# Patient Record
Sex: Female | Born: 1960 | Race: White | Hispanic: No | Marital: Married | Smoking: Former smoker
Health system: Southern US, Community
[De-identification: ages and names within clinical notes are randomized; demographics above are authoritative.]

## PROBLEM LIST (undated history)

## (undated) DIAGNOSIS — Z8669 Personal history of other diseases of the nervous system and sense organs: Secondary | ICD-10-CM

## (undated) DIAGNOSIS — M8580 Other specified disorders of bone density and structure, unspecified site: Secondary | ICD-10-CM

## (undated) DIAGNOSIS — R002 Palpitations: Secondary | ICD-10-CM

## (undated) DIAGNOSIS — B009 Herpesviral infection, unspecified: Secondary | ICD-10-CM

## (undated) DIAGNOSIS — E278 Other specified disorders of adrenal gland: Secondary | ICD-10-CM

## (undated) DIAGNOSIS — F419 Anxiety disorder, unspecified: Secondary | ICD-10-CM

## (undated) DIAGNOSIS — K5520 Angiodysplasia of colon without hemorrhage: Secondary | ICD-10-CM

## (undated) DIAGNOSIS — R1012 Left upper quadrant pain: Secondary | ICD-10-CM

## (undated) HISTORY — DX: Other specified disorders of bone density and structure, unspecified site: M85.80

## (undated) HISTORY — DX: Left upper quadrant pain: R10.12

## (undated) HISTORY — DX: Other specified disorders of adrenal gland: E27.8

## (undated) HISTORY — DX: Herpesviral infection, unspecified: B00.9

## (undated) HISTORY — PX: UPPER GASTROINTESTINAL ENDOSCOPY: SHX188

## (undated) HISTORY — PX: MOUTH SURGERY: SHX715

## (undated) HISTORY — DX: Palpitations: R00.2

## (undated) HISTORY — DX: Angiodysplasia of colon without hemorrhage: K55.20

## (undated) HISTORY — PX: COLONOSCOPY: SHX174

## (undated) HISTORY — DX: Anxiety disorder, unspecified: F41.9

## (undated) HISTORY — DX: Personal history of other diseases of the nervous system and sense organs: Z86.69

---

## 1997-04-06 HISTORY — PX: NASAL SINUS SURGERY: SHX719

## 2001-03-09 ENCOUNTER — Other Ambulatory Visit: Admission: RE | Admit: 2001-03-09 | Discharge: 2001-03-09 | Payer: Self-pay | Admitting: Obstetrics and Gynecology

## 2001-06-01 ENCOUNTER — Encounter: Payer: Self-pay | Admitting: Obstetrics and Gynecology

## 2001-06-01 ENCOUNTER — Encounter: Admission: RE | Admit: 2001-06-01 | Discharge: 2001-06-01 | Payer: Self-pay | Admitting: Obstetrics and Gynecology

## 2002-06-07 ENCOUNTER — Encounter: Admission: RE | Admit: 2002-06-07 | Discharge: 2002-06-07 | Payer: Self-pay | Admitting: Obstetrics and Gynecology

## 2002-06-07 ENCOUNTER — Encounter: Payer: Self-pay | Admitting: Obstetrics and Gynecology

## 2003-04-18 ENCOUNTER — Emergency Department (HOSPITAL_COMMUNITY): Admission: EM | Admit: 2003-04-18 | Discharge: 2003-04-18 | Payer: Self-pay | Admitting: Emergency Medicine

## 2003-06-08 ENCOUNTER — Encounter: Admission: RE | Admit: 2003-06-08 | Discharge: 2003-06-08 | Payer: Self-pay | Admitting: Obstetrics and Gynecology

## 2004-03-10 ENCOUNTER — Ambulatory Visit: Payer: Self-pay | Admitting: Internal Medicine

## 2004-03-18 ENCOUNTER — Ambulatory Visit: Payer: Self-pay

## 2004-09-09 ENCOUNTER — Encounter: Admission: RE | Admit: 2004-09-09 | Discharge: 2004-09-09 | Payer: Self-pay | Admitting: Obstetrics and Gynecology

## 2005-01-12 ENCOUNTER — Ambulatory Visit: Payer: Self-pay | Admitting: Internal Medicine

## 2005-02-13 ENCOUNTER — Ambulatory Visit: Payer: Self-pay | Admitting: Internal Medicine

## 2005-03-06 DIAGNOSIS — R1012 Left upper quadrant pain: Secondary | ICD-10-CM

## 2005-03-06 HISTORY — DX: Left upper quadrant pain: R10.12

## 2005-03-30 ENCOUNTER — Emergency Department (HOSPITAL_COMMUNITY): Admission: EM | Admit: 2005-03-30 | Discharge: 2005-03-30 | Payer: Self-pay | Admitting: Emergency Medicine

## 2005-03-31 ENCOUNTER — Ambulatory Visit: Payer: Self-pay | Admitting: Internal Medicine

## 2005-04-02 ENCOUNTER — Ambulatory Visit: Payer: Self-pay | Admitting: *Deleted

## 2005-04-07 ENCOUNTER — Ambulatory Visit: Payer: Self-pay | Admitting: Internal Medicine

## 2005-04-09 ENCOUNTER — Encounter: Payer: Self-pay | Admitting: Internal Medicine

## 2005-04-09 ENCOUNTER — Ambulatory Visit: Payer: Self-pay | Admitting: Internal Medicine

## 2005-04-14 ENCOUNTER — Encounter (INDEPENDENT_AMBULATORY_CARE_PROVIDER_SITE_OTHER): Payer: Self-pay | Admitting: *Deleted

## 2005-04-14 ENCOUNTER — Ambulatory Visit (HOSPITAL_BASED_OUTPATIENT_CLINIC_OR_DEPARTMENT_OTHER): Admission: RE | Admit: 2005-04-14 | Discharge: 2005-04-14 | Payer: Self-pay | Admitting: General Surgery

## 2005-04-16 ENCOUNTER — Encounter (INDEPENDENT_AMBULATORY_CARE_PROVIDER_SITE_OTHER): Payer: Self-pay | Admitting: *Deleted

## 2005-04-16 ENCOUNTER — Ambulatory Visit: Payer: Self-pay | Admitting: Internal Medicine

## 2005-05-01 ENCOUNTER — Ambulatory Visit: Payer: Self-pay | Admitting: Internal Medicine

## 2005-06-15 ENCOUNTER — Ambulatory Visit (HOSPITAL_COMMUNITY): Admission: RE | Admit: 2005-06-15 | Discharge: 2005-06-15 | Payer: Self-pay | Admitting: Family Medicine

## 2005-09-08 ENCOUNTER — Ambulatory Visit: Payer: Self-pay | Admitting: Internal Medicine

## 2005-09-23 ENCOUNTER — Ambulatory Visit: Payer: Self-pay | Admitting: Internal Medicine

## 2005-09-30 ENCOUNTER — Encounter: Admission: RE | Admit: 2005-09-30 | Discharge: 2005-09-30 | Payer: Self-pay | Admitting: Obstetrics and Gynecology

## 2005-10-15 ENCOUNTER — Ambulatory Visit: Payer: Self-pay | Admitting: Internal Medicine

## 2005-10-29 ENCOUNTER — Encounter: Payer: Self-pay | Admitting: Internal Medicine

## 2005-10-29 ENCOUNTER — Ambulatory Visit (HOSPITAL_COMMUNITY): Admission: RE | Admit: 2005-10-29 | Discharge: 2005-10-29 | Payer: Self-pay | Admitting: Internal Medicine

## 2005-12-01 ENCOUNTER — Ambulatory Visit (HOSPITAL_COMMUNITY): Admission: RE | Admit: 2005-12-01 | Discharge: 2005-12-01 | Payer: Self-pay | Admitting: Internal Medicine

## 2005-12-10 ENCOUNTER — Encounter: Payer: Self-pay | Admitting: Internal Medicine

## 2005-12-18 ENCOUNTER — Ambulatory Visit: Payer: Self-pay | Admitting: Internal Medicine

## 2005-12-28 ENCOUNTER — Ambulatory Visit: Payer: Self-pay | Admitting: Cardiology

## 2006-01-01 ENCOUNTER — Encounter: Payer: Self-pay | Admitting: Internal Medicine

## 2006-02-12 ENCOUNTER — Ambulatory Visit: Payer: Self-pay | Admitting: Endocrinology

## 2006-05-26 ENCOUNTER — Ambulatory Visit: Payer: Self-pay | Admitting: Internal Medicine

## 2006-06-04 ENCOUNTER — Ambulatory Visit: Payer: Self-pay | Admitting: Internal Medicine

## 2006-06-09 ENCOUNTER — Ambulatory Visit: Payer: Self-pay | Admitting: Internal Medicine

## 2006-06-10 ENCOUNTER — Ambulatory Visit: Payer: Self-pay | Admitting: Internal Medicine

## 2006-06-10 ENCOUNTER — Encounter: Payer: Self-pay | Admitting: Internal Medicine

## 2006-06-10 LAB — CONVERTED CEMR LAB: Cortisol, Plasma: 1.3 ug/dL

## 2006-07-15 ENCOUNTER — Ambulatory Visit: Payer: Self-pay | Admitting: Internal Medicine

## 2006-07-16 ENCOUNTER — Ambulatory Visit: Payer: Self-pay | Admitting: Internal Medicine

## 2006-09-08 ENCOUNTER — Ambulatory Visit: Payer: Self-pay | Admitting: Internal Medicine

## 2006-10-12 ENCOUNTER — Encounter: Admission: RE | Admit: 2006-10-12 | Discharge: 2006-10-12 | Payer: Self-pay | Admitting: Obstetrics and Gynecology

## 2006-11-22 LAB — CONVERTED CEMR LAB: Pap Smear: NORMAL

## 2006-12-03 ENCOUNTER — Ambulatory Visit: Payer: Self-pay | Admitting: Internal Medicine

## 2006-12-03 LAB — CONVERTED CEMR LAB
ALT: 14 units/L (ref 0–35)
Alkaline Phosphatase: 37 units/L — ABNORMAL LOW (ref 39–117)
Creatinine, Ser: 0.7 mg/dL (ref 0.4–1.2)
Eosinophils Relative: 1.1 % (ref 0.0–5.0)
Folate: 10.4 ng/mL
GFR calc Af Amer: 115 mL/min
GFR calc non Af Amer: 95 mL/min
Hemoglobin: 12.1 g/dL (ref 12.0–15.0)
Iron: 73 ug/dL (ref 42–145)
Monocytes Relative: 7.6 % (ref 3.0–11.0)
Neutrophils Relative %: 67.4 % (ref 43.0–77.0)
Platelets: 377 10*3/uL (ref 150–400)
Potassium: 4.1 meq/L (ref 3.5–5.1)
RDW: 13.3 % (ref 11.5–14.6)
Sodium: 138 meq/L (ref 135–145)
TSH: 1.19 microintl units/mL (ref 0.35–5.50)
Total Protein: 6.6 g/dL (ref 6.0–8.3)
WBC: 10.6 10*3/uL — ABNORMAL HIGH (ref 4.5–10.5)

## 2006-12-10 ENCOUNTER — Encounter: Admission: RE | Admit: 2006-12-10 | Discharge: 2006-12-10 | Payer: Self-pay | Admitting: Internal Medicine

## 2006-12-14 ENCOUNTER — Encounter: Payer: Self-pay | Admitting: Internal Medicine

## 2006-12-20 ENCOUNTER — Encounter: Payer: Self-pay | Admitting: Internal Medicine

## 2007-02-25 ENCOUNTER — Ambulatory Visit: Payer: Self-pay | Admitting: Internal Medicine

## 2007-04-08 ENCOUNTER — Ambulatory Visit: Payer: Self-pay | Admitting: Internal Medicine

## 2007-04-08 DIAGNOSIS — R002 Palpitations: Secondary | ICD-10-CM

## 2007-04-15 LAB — CONVERTED CEMR LAB
CO2: 30 meq/L (ref 19–32)
Creatinine, Ser: 0.8 mg/dL (ref 0.4–1.2)
Free T4: 0.7 ng/dL (ref 0.6–1.6)
GFR calc Af Amer: 99 mL/min
Glucose, Bld: 89 mg/dL (ref 70–99)
Potassium: 4.1 meq/L (ref 3.5–5.1)
T3, Free: 2.4 pg/mL (ref 2.3–4.2)

## 2007-05-06 DIAGNOSIS — G43909 Migraine, unspecified, not intractable, without status migrainosus: Secondary | ICD-10-CM | POA: Insufficient documentation

## 2007-05-06 DIAGNOSIS — M899 Disorder of bone, unspecified: Secondary | ICD-10-CM | POA: Insufficient documentation

## 2007-05-06 DIAGNOSIS — M949 Disorder of cartilage, unspecified: Secondary | ICD-10-CM

## 2007-05-28 ENCOUNTER — Encounter: Payer: Self-pay | Admitting: Internal Medicine

## 2007-05-30 ENCOUNTER — Telehealth (INDEPENDENT_AMBULATORY_CARE_PROVIDER_SITE_OTHER): Payer: Self-pay | Admitting: *Deleted

## 2007-06-02 ENCOUNTER — Ambulatory Visit: Payer: Self-pay | Admitting: Internal Medicine

## 2007-06-02 DIAGNOSIS — M549 Dorsalgia, unspecified: Secondary | ICD-10-CM | POA: Insufficient documentation

## 2007-06-02 DIAGNOSIS — A6000 Herpesviral infection of urogenital system, unspecified: Secondary | ICD-10-CM | POA: Insufficient documentation

## 2007-06-02 DIAGNOSIS — D759 Disease of blood and blood-forming organs, unspecified: Secondary | ICD-10-CM | POA: Insufficient documentation

## 2007-06-02 DIAGNOSIS — F411 Generalized anxiety disorder: Secondary | ICD-10-CM | POA: Insufficient documentation

## 2007-06-03 ENCOUNTER — Encounter (INDEPENDENT_AMBULATORY_CARE_PROVIDER_SITE_OTHER): Payer: Self-pay | Admitting: *Deleted

## 2007-06-14 ENCOUNTER — Ambulatory Visit: Payer: Self-pay | Admitting: Internal Medicine

## 2007-06-15 ENCOUNTER — Ambulatory Visit (HOSPITAL_COMMUNITY): Payer: Self-pay | Admitting: Oncology

## 2007-06-15 ENCOUNTER — Encounter: Payer: Self-pay | Admitting: Internal Medicine

## 2007-06-15 ENCOUNTER — Encounter (HOSPITAL_COMMUNITY): Admission: RE | Admit: 2007-06-15 | Discharge: 2007-07-15 | Payer: Self-pay | Admitting: Oncology

## 2007-07-04 ENCOUNTER — Encounter: Payer: Self-pay | Admitting: Internal Medicine

## 2007-09-28 ENCOUNTER — Telehealth (INDEPENDENT_AMBULATORY_CARE_PROVIDER_SITE_OTHER): Payer: Self-pay | Admitting: *Deleted

## 2007-09-30 ENCOUNTER — Ambulatory Visit: Payer: Self-pay | Admitting: Internal Medicine

## 2007-09-30 DIAGNOSIS — K589 Irritable bowel syndrome without diarrhea: Secondary | ICD-10-CM

## 2007-10-01 LAB — CONVERTED CEMR LAB
BUN: 17 mg/dL (ref 6–23)
Basophils Absolute: 0.1 10*3/uL (ref 0.0–0.1)
Basophils Relative: 0.9 % (ref 0.0–1.0)
CO2: 26 meq/L (ref 19–32)
Calcium: 9.2 mg/dL (ref 8.4–10.5)
Eosinophils Absolute: 0.2 10*3/uL (ref 0.0–0.7)
Eosinophils Relative: 1.3 % (ref 0.0–5.0)
GFR calc Af Amer: 86 mL/min
Glucose, Bld: 93 mg/dL (ref 70–99)
HCT: 39 % (ref 36.0–46.0)
Hemoglobin: 13.3 g/dL (ref 12.0–15.0)
MCHC: 34 g/dL (ref 30.0–36.0)
MCV: 89.4 fL (ref 78.0–100.0)
Monocytes Absolute: 0.8 10*3/uL (ref 0.1–1.0)
Neutro Abs: 8.7 10*3/uL — ABNORMAL HIGH (ref 1.4–7.7)
RBC: 4.36 M/uL (ref 3.87–5.11)
Sodium: 139 meq/L (ref 135–145)
TSH: 1.02 microintl units/mL (ref 0.35–5.50)
WBC: 12.5 10*3/uL — ABNORMAL HIGH (ref 4.5–10.5)

## 2007-10-25 ENCOUNTER — Encounter: Admission: RE | Admit: 2007-10-25 | Discharge: 2007-10-25 | Payer: Self-pay | Admitting: *Deleted

## 2008-01-03 ENCOUNTER — Encounter: Payer: Self-pay | Admitting: Internal Medicine

## 2008-01-04 ENCOUNTER — Encounter: Payer: Self-pay | Admitting: Internal Medicine

## 2008-01-06 ENCOUNTER — Telehealth: Payer: Self-pay | Admitting: Internal Medicine

## 2008-01-06 ENCOUNTER — Ambulatory Visit: Payer: Self-pay | Admitting: Gastroenterology

## 2008-01-10 ENCOUNTER — Telehealth: Payer: Self-pay | Admitting: Internal Medicine

## 2008-01-12 ENCOUNTER — Telehealth (INDEPENDENT_AMBULATORY_CARE_PROVIDER_SITE_OTHER): Payer: Self-pay | Admitting: *Deleted

## 2008-01-13 ENCOUNTER — Telehealth: Payer: Self-pay | Admitting: Internal Medicine

## 2008-01-13 ENCOUNTER — Ambulatory Visit: Payer: Self-pay | Admitting: Internal Medicine

## 2008-01-13 DIAGNOSIS — Z91041 Radiographic dye allergy status: Secondary | ICD-10-CM

## 2008-01-17 ENCOUNTER — Encounter: Payer: Self-pay | Admitting: Internal Medicine

## 2008-01-17 ENCOUNTER — Ambulatory Visit: Payer: Self-pay | Admitting: Internal Medicine

## 2008-01-19 ENCOUNTER — Telehealth (INDEPENDENT_AMBULATORY_CARE_PROVIDER_SITE_OTHER): Payer: Self-pay | Admitting: *Deleted

## 2008-01-27 ENCOUNTER — Ambulatory Visit: Payer: Self-pay | Admitting: Internal Medicine

## 2008-01-31 ENCOUNTER — Encounter: Payer: Self-pay | Admitting: Internal Medicine

## 2008-02-01 LAB — CONVERTED CEMR LAB: Calcium, Total (PTH): 9.1 mg/dL (ref 8.4–10.5)

## 2008-02-07 ENCOUNTER — Telehealth: Payer: Self-pay | Admitting: Internal Medicine

## 2008-02-24 ENCOUNTER — Ambulatory Visit: Payer: Self-pay | Admitting: Internal Medicine

## 2008-03-07 ENCOUNTER — Telehealth: Payer: Self-pay | Admitting: Internal Medicine

## 2008-03-12 ENCOUNTER — Ambulatory Visit: Payer: Self-pay | Admitting: Internal Medicine

## 2008-03-14 ENCOUNTER — Encounter: Payer: Self-pay | Admitting: Internal Medicine

## 2008-04-05 ENCOUNTER — Telehealth: Payer: Self-pay | Admitting: Internal Medicine

## 2008-04-05 DIAGNOSIS — K633 Ulcer of intestine: Secondary | ICD-10-CM

## 2008-04-30 ENCOUNTER — Telehealth: Payer: Self-pay | Admitting: Internal Medicine

## 2008-05-14 ENCOUNTER — Encounter: Payer: Self-pay | Admitting: Internal Medicine

## 2008-05-25 ENCOUNTER — Encounter: Payer: Self-pay | Admitting: Internal Medicine

## 2008-07-02 ENCOUNTER — Telehealth: Payer: Self-pay | Admitting: Internal Medicine

## 2008-07-05 ENCOUNTER — Ambulatory Visit: Payer: Self-pay | Admitting: Internal Medicine

## 2008-07-09 ENCOUNTER — Ambulatory Visit: Payer: Self-pay | Admitting: Internal Medicine

## 2008-07-09 ENCOUNTER — Encounter: Payer: Self-pay | Admitting: Internal Medicine

## 2008-07-20 ENCOUNTER — Telehealth (INDEPENDENT_AMBULATORY_CARE_PROVIDER_SITE_OTHER): Payer: Self-pay | Admitting: *Deleted

## 2008-07-24 ENCOUNTER — Ambulatory Visit: Payer: Self-pay | Admitting: Internal Medicine

## 2008-07-24 DIAGNOSIS — J310 Chronic rhinitis: Secondary | ICD-10-CM | POA: Insufficient documentation

## 2008-09-11 ENCOUNTER — Telehealth: Payer: Self-pay | Admitting: Internal Medicine

## 2008-09-11 ENCOUNTER — Ambulatory Visit: Payer: Self-pay | Admitting: Family Medicine

## 2008-09-11 ENCOUNTER — Telehealth (INDEPENDENT_AMBULATORY_CARE_PROVIDER_SITE_OTHER): Payer: Self-pay | Admitting: *Deleted

## 2008-09-11 DIAGNOSIS — R0602 Shortness of breath: Secondary | ICD-10-CM

## 2008-09-11 DIAGNOSIS — N951 Menopausal and female climacteric states: Secondary | ICD-10-CM

## 2008-09-12 ENCOUNTER — Encounter: Payer: Self-pay | Admitting: Family Medicine

## 2008-09-12 ENCOUNTER — Encounter: Admission: RE | Admit: 2008-09-12 | Discharge: 2008-09-12 | Payer: Self-pay | Admitting: Family Medicine

## 2008-09-18 ENCOUNTER — Ambulatory Visit: Payer: Self-pay | Admitting: Family Medicine

## 2008-09-20 LAB — CONVERTED CEMR LAB
Basophils Absolute: 0 K/uL (ref 0.0–0.1)
Basophils Relative: 0.5 % (ref 0.0–3.0)
Eosinophils Absolute: 0.1 K/uL (ref 0.0–0.7)
Eosinophils Relative: 0.6 % (ref 0.0–5.0)
HCT: 41 % (ref 36.0–46.0)
Hemoglobin: 14.1 g/dL (ref 12.0–15.0)
Lymphocytes Relative: 22.2 % (ref 12.0–46.0)
Lymphs Abs: 1.9 K/uL (ref 0.7–4.0)
MCHC: 34.5 g/dL (ref 30.0–36.0)
MCV: 89.8 fL (ref 78.0–100.0)
Monocytes Absolute: 0.7 K/uL (ref 0.1–1.0)
Monocytes Relative: 7.5 % (ref 3.0–12.0)
Neutro Abs: 6 K/uL (ref 1.4–7.7)
Neutrophils Relative %: 69.2 % (ref 43.0–77.0)
Platelets: 338 K/uL (ref 150.0–400.0)
RBC: 4.56 M/uL (ref 3.87–5.11)
RDW: 12.4 % (ref 11.5–14.6)
WBC: 8.7 10*3/microliter (ref 4.5–10.5)

## 2008-10-10 ENCOUNTER — Encounter: Payer: Self-pay | Admitting: Internal Medicine

## 2008-10-10 ENCOUNTER — Telehealth: Payer: Self-pay | Admitting: Internal Medicine

## 2008-10-10 ENCOUNTER — Ambulatory Visit: Payer: Self-pay | Admitting: Family Medicine

## 2008-10-11 ENCOUNTER — Telehealth: Payer: Self-pay | Admitting: Family Medicine

## 2008-10-19 ENCOUNTER — Ambulatory Visit: Payer: Self-pay | Admitting: Internal Medicine

## 2008-10-22 ENCOUNTER — Telehealth: Payer: Self-pay | Admitting: Internal Medicine

## 2008-10-24 ENCOUNTER — Ambulatory Visit: Payer: Self-pay | Admitting: Internal Medicine

## 2008-10-25 ENCOUNTER — Encounter: Admission: RE | Admit: 2008-10-25 | Discharge: 2008-10-25 | Payer: Self-pay | Admitting: Family Medicine

## 2008-10-25 ENCOUNTER — Telehealth: Payer: Self-pay | Admitting: Internal Medicine

## 2008-11-01 ENCOUNTER — Ambulatory Visit: Payer: Self-pay | Admitting: Internal Medicine

## 2008-11-02 ENCOUNTER — Ambulatory Visit: Payer: Self-pay | Admitting: Internal Medicine

## 2008-11-23 ENCOUNTER — Ambulatory Visit: Payer: Self-pay | Admitting: Family Medicine

## 2008-11-23 DIAGNOSIS — Q767 Congenital malformation of sternum: Secondary | ICD-10-CM

## 2008-11-23 DIAGNOSIS — Q766 Other congenital malformations of ribs: Secondary | ICD-10-CM

## 2008-12-27 ENCOUNTER — Telehealth: Payer: Self-pay | Admitting: Family Medicine

## 2008-12-28 ENCOUNTER — Ambulatory Visit: Payer: Self-pay | Admitting: Family Medicine

## 2008-12-28 ENCOUNTER — Telehealth: Payer: Self-pay | Admitting: Family Medicine

## 2008-12-28 DIAGNOSIS — K3189 Other diseases of stomach and duodenum: Secondary | ICD-10-CM | POA: Insufficient documentation

## 2008-12-28 DIAGNOSIS — R1013 Epigastric pain: Secondary | ICD-10-CM

## 2009-01-14 ENCOUNTER — Encounter: Payer: Self-pay | Admitting: Family Medicine

## 2009-02-26 ENCOUNTER — Ambulatory Visit: Payer: Self-pay | Admitting: Family Medicine

## 2009-02-26 DIAGNOSIS — N390 Urinary tract infection, site not specified: Secondary | ICD-10-CM

## 2009-02-26 LAB — CONVERTED CEMR LAB
Bilirubin Urine: NEGATIVE
Protein, U semiquant: NEGATIVE
Urobilinogen, UA: 0.2

## 2009-03-01 ENCOUNTER — Telehealth: Payer: Self-pay | Admitting: Family Medicine

## 2009-03-07 ENCOUNTER — Telehealth (INDEPENDENT_AMBULATORY_CARE_PROVIDER_SITE_OTHER): Payer: Self-pay | Admitting: *Deleted

## 2009-04-08 ENCOUNTER — Ambulatory Visit: Payer: Self-pay | Admitting: Family Medicine

## 2009-04-08 DIAGNOSIS — J029 Acute pharyngitis, unspecified: Secondary | ICD-10-CM

## 2009-04-24 ENCOUNTER — Ambulatory Visit: Payer: Self-pay | Admitting: Family Medicine

## 2009-04-24 DIAGNOSIS — I781 Nevus, non-neoplastic: Secondary | ICD-10-CM

## 2009-04-24 DIAGNOSIS — R5381 Other malaise: Secondary | ICD-10-CM | POA: Insufficient documentation

## 2009-04-24 DIAGNOSIS — R5383 Other fatigue: Secondary | ICD-10-CM

## 2009-04-24 DIAGNOSIS — R0989 Other specified symptoms and signs involving the circulatory and respiratory systems: Secondary | ICD-10-CM | POA: Insufficient documentation

## 2009-04-24 DIAGNOSIS — R0609 Other forms of dyspnea: Secondary | ICD-10-CM

## 2009-04-25 LAB — CONVERTED CEMR LAB
CO2: 23 meq/L (ref 19–32)
Cholesterol: 198 mg/dL (ref 0–200)
Creatinine, Ser: 0.87 mg/dL (ref 0.40–1.20)
Glucose, Bld: 91 mg/dL (ref 70–99)
HCT: 39.6 % (ref 36.0–46.0)
HDL: 60 mg/dL (ref >39)
MCV: 87.6 fL (ref 78.0–100.0)
Platelets: 401 10*3/uL — ABNORMAL HIGH (ref 150–400)
RBC: 4.52 M/uL (ref 3.87–5.11)
Total Bilirubin: 0.5 mg/dL (ref 0.3–1.2)
Total CHOL/HDL Ratio: 3.3
Triglycerides: 72 mg/dL (ref <150)
VLDL: 14 mg/dL (ref 0–40)
WBC: 10.3 10*3/uL (ref 4.0–10.5)

## 2009-05-07 ENCOUNTER — Telehealth (INDEPENDENT_AMBULATORY_CARE_PROVIDER_SITE_OTHER): Payer: Self-pay | Admitting: *Deleted

## 2009-05-08 ENCOUNTER — Encounter (HOSPITAL_COMMUNITY): Admission: RE | Admit: 2009-05-08 | Discharge: 2009-07-11 | Payer: Self-pay | Admitting: Family Medicine

## 2009-05-08 ENCOUNTER — Ambulatory Visit: Payer: Self-pay | Admitting: Cardiovascular Disease

## 2009-05-08 ENCOUNTER — Ambulatory Visit: Payer: Self-pay

## 2009-08-22 ENCOUNTER — Telehealth (INDEPENDENT_AMBULATORY_CARE_PROVIDER_SITE_OTHER): Payer: Self-pay | Admitting: *Deleted

## 2009-11-27 ENCOUNTER — Encounter: Admission: RE | Admit: 2009-11-27 | Discharge: 2009-11-27 | Payer: Self-pay | Admitting: Obstetrics

## 2010-03-11 ENCOUNTER — Ambulatory Visit: Payer: Self-pay | Admitting: Family Medicine

## 2010-03-11 DIAGNOSIS — J321 Chronic frontal sinusitis: Secondary | ICD-10-CM

## 2010-04-26 ENCOUNTER — Encounter: Payer: Self-pay | Admitting: Internal Medicine

## 2010-04-27 ENCOUNTER — Encounter: Payer: Self-pay | Admitting: Internal Medicine

## 2010-04-27 ENCOUNTER — Encounter (HOSPITAL_COMMUNITY): Payer: Self-pay | Admitting: Oncology

## 2010-05-04 LAB — CONVERTED CEMR LAB
ALT: 15 units/L (ref 0–35)
AST: 21 units/L (ref 0–37)
Albumin: 4.2 g/dL (ref 3.5–5.2)
Basophils Absolute: 0 10*3/uL (ref 0.0–0.1)
Bilirubin Urine: NEGATIVE
CO2: 28 meq/L (ref 19–32)
Calcium: 9 mg/dL (ref 8.4–10.5)
Chloride: 103 meq/L (ref 96–112)
Eosinophils Relative: 0.1 % (ref 0.0–5.0)
Hemoglobin: 13.2 g/dL (ref 12.0–15.0)
Lymphocytes Relative: 8.7 % — ABNORMAL LOW (ref 12.0–46.0)
Monocytes Relative: 3.2 % (ref 3.0–12.0)
Platelets: 382 10*3/uL (ref 150.0–400.0)
Potassium: 4.5 meq/L (ref 3.5–5.1)
RDW: 12.5 % (ref 11.5–14.6)
Specific Gravity, Urine: 1.025
TSH: 1.11 microintl units/mL (ref 0.35–5.50)
Total Protein: 7.5 g/dL (ref 6.0–8.3)
Urobilinogen, UA: 0.2
WBC Urine, dipstick: NEGATIVE
WBC: 15.2 10*3/uL — ABNORMAL HIGH (ref 4.5–10.5)

## 2010-05-06 NOTE — Assessment & Plan Note (Signed)
Summary: CPE w/o pap   Vital Signs:  Patient profile:   50 year old female Height:      69 inches Weight:      146 pounds BMI:     21.64 O2 Sat:      98 % on Room air Pulse rate:   71 / minute BP sitting:   113 / 76  (left arm) Cuff size:   regular  Vitals Entered By: Payton Spark CMA (April 24, 2009 8:16 AM)  O2 Flow:  Room air CC: CPE w/ out pap   Primary Care Provider:  Seymour Bars DO  CC:  CPE w/ out pap.  History of Present Illness: 50 yo WF presents for CPE.  She is G2P2, perimenopausal.  Sees [facility] for gyn exams.   Mammogram was updated July 2010.  Colonoscopy done in 09, normal.  She is previously healthy other than chronic complaints of upper abdominal pain and she has had DOE on and off.  She is not a smoker.  She is due for fasting labs.  She had a normal DEXA scan in April 2010.  Her tetanus vaccine is due.  She would like to restart Nasonex for chronic postnasal drip.  She has started exercising though not on a regular basis.    Current Medications (verified): 1)  Multivitamins  Tabs (Multiple Vitamin) .... Take 1 Tab Daily 2)  Valtrex 1 Gm Tabs (Valacyclovir Hcl) .... 1/2 Tablet By Mouth Once Daily 3)  Ventolin Hfa 108 (90 Base) Mcg/act  Aers (Albuterol Sulfate) .Marland Kitchen.. 1-2 Puffs Every 4-6 Hours As Needed 4)  Caltrate 600+d 600-400 Mg-Unit Tabs (Calcium Carbonate-Vitamin D) .Marland Kitchen.. 1 Once Daily  Allergies (verified): 1)  ! Sulfa 2)  ! 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 s/p sinus surgury 1999 NO PRIOR ABDOMINAL SURGERY  Family History: Reviewed history from 09/18/2008 and no changes required.  NO FAM HX COLON CA TB-  Father, step daughter had positive skin test. Mother osteoporosis, heart murmur Father A. Fib.  Social History: Reviewed history from 07/05/2008 and no changes required. Occupation: Air traffic controller, Production designer, theatre/television/film Married Former smoker.  Smoked while in [place].  Only smoked socially. Alcohol use-yes 2 children  Review of Systems  The patient denies anorexia, fever, weight loss, weight gain, vision loss, decreased hearing, hoarseness, chest pain, syncope, dyspnea on exertion, peripheral edema, prolonged cough, headaches, hemoptysis, abdominal pain, melena, hematochezia, severe indigestion/heartburn, hematuria, incontinence, genital sores, muscle weakness, suspicious skin lesions, transient blindness, difficulty walking, depression, unusual weight change, abnormal bleeding, enlarged lymph nodes, angioedema, breast masses, and testicular masses.    Physical Exam  General:  alert, well-developed, well-nourished, and well-hydrated.   Head:  normocephalic and atraumatic.   Eyes:  pupils equal, pupils round, and pupils reactive to light.   Ears:  EACs patent; TMs translucent and gray with good cone of light and bony landmarks.  Nose:  no nasal discharge.   Mouth:  good dentition and pharynx pink and moist.  cobblestoning Neck:  no masses.   Lungs:  Normal respiratory effort, chest expands symmetrically. Lungs are clear to auscultation, no crackles or wheezes. Heart:  Normal rate and regular rhythm. S1 and S2 normal  without gallop, murmur, click, rub or other extra sounds. Abdomen:  soft, non-tender, normal bowel sounds, no distention, no masses, no guarding, no hepatomegaly, and no splenomegaly.   Msk:  no joint tenderness, no joint swelling, no joint warmth, and no redness over joints.   Pulses:  2+ radial and pedal pulses Extremities:  no E/C/C Neurologic:  gait normal.   Skin:  dark flat mole over Lateral L lower leg, small pink scaley lesion over the R anterior lower leg. Cervical Nodes:  No  lymphadenopathy noted Psych:  good eye contact, not anxious appearing, and not depressed appearing.     Impression & Recommendations:  Problem # 1:  HEALTH MAINTENANCE EXAM (ICD-V70.0) Keeping healthy checklist for women reviewed. BP and BMI are perfect. Tdap updated today. Colonoscopy normal 09, repeat in 2019. Ordered a treadmill myoview for vague chronic c/o DOE (normal EKGs in the past). Labs updated, include TSH and CBC for DOE. Recommend regular exercise, healthy diet, daily MVI and calcium with Vit D daily. Paps per [facility].  Mammogram due 10-2009. DEXA normal 4-10.  Complete Medication List: 1)  Multivitamins Tabs (Multiple vitamin) .... Take 1 tab daily 2)  Valtrex 1 Gm Tabs (Valacyclovir hcl) .... 1/2 tablet by mouth once daily 3)  Ventolin Hfa 108 (90 Base) Mcg/act Aers (Albuterol sulfate) .Marland Kitchen.. 1-2 puffs every 4-6 hours as needed 4)  Caltrate 600+d 600-400 Mg-unit Tabs (Calcium carbonate-vitamin d) .Marland Kitchen.. 1 once daily 5)  Nasonex 50 Mcg/act Susp (Mometasone furoate) .... 2 sprays per nostril daily  Other Orders: T-Treadmill Complete/ Pharmacologic Stress (93015) T-CBC No Diff ([id number]) T-Comprehensive Metabolic Panel [id number]) T-Lipid Profile [phone]) T-TSH [phone]) T-Vitamin D (25-Hydroxy) [phone]) Dermatology Referral ([facility]erm[facility]) Tdap => 3yrs IM (33295) Admin 1st Vaccine (18841) Admin 1st Vaccine [facility]) [phone])  Patient Instructions: 1)  Derm referral made. 2)  Stress test to be set up at [facility]. 3)  Fasting labs today. 4)  Will call you w/ results tomorrow. 5)  Tetanus update today. Prescriptions: NASONEX 50 MCG/ACT SUSP (MOMETASONE FUROATE) 2 sprays per nostril daily  #1 bottle x 3   Entered and Authorized by:   Seymour Bars DO   Signed by:   Seymour Bars DO on 04/24/2009   Method used:   Electronically to        [facility] [phone]* (retail)       [address] [place], [place]  [address]       Ph: [phone]  or [phone]       Fax: [phone]   RxID:   [id number]    Tetanus/Td Vaccine    Vaccine Type: Tdap    Site: left deltoid    Mfr: GlaxoSmithKline    Dose: 0.5 ml    Route: IM    Given by: Payton Spark CMA    Exp. Date: 06/01/2011    Lot #: [id number]    VIS given: 02/22/07 version given April 24, 2009.

## 2010-05-06 NOTE — Assessment & Plan Note (Signed)
Summary: sinusitis   Vital Signs:  Patient profile:   50 year old female Height:      69.5 inches Weight:      150 pounds BMI:     21.91 O2 Sat:      99 % on Room air Temp:     98.4 degrees F oral Pulse rate:   68 / minute BP sitting:   140 / 87  (left arm) Cuff size:   regular  Vitals Entered By: Payton Spark CMA (March 11, 2010 3:27 PM)  O2 Flow:  Room air CC: Cough, congestion R ear ache x 1.5 weeks.   Primary Care Provider:  Seymour Bars DO  CC:  Cough and congestion R ear ache x 1.5 weeks.Marland Kitchen  History of Present Illness: 50 yo  WF presents for 2 wks of head congestion, postnasal drip and cough.  Using plain Mucinex which is helping.  Has subjective fevers and chills.  Feels tired and achey.  She has eye sympotms with ear pressure and chest congestion.  Her cough is dry.  She is able to sleep OK.  Denies CP or DOE.      Current Medications (verified): 1)  Multivitamins  Tabs (Multiple Vitamin) .... Take 1 Tab Daily 2)  Valtrex 1 Gm Tabs (Valacyclovir Hcl) .... 1/2 Tablet By Mouth Once Daily 3)  Caltrate 600+d 600-400 Mg-Unit Tabs (Calcium Carbonate-Vitamin D) .Marland Kitchen.. 1 Once Daily 4)  Nasonex 50 Mcg/act Susp (Mometasone Furoate) .... 2 Sprays Per Nostril Daily  Allergies (verified): 1)  ! Sulfa 2)  ! * Iv Contrast  Review of Systems      See HPI  Physical Exam  General:  alert, well-developed, well-nourished, and well-hydrated.   Head:  normocephalic and atraumatic.  frontal sinus TTP Eyes:  conjunctiva clear, slightly watery Ears:  EACs patent; TMs translucent and gray with good cone of light and bony landmarks.  Nose:  nasal congestion with boggy turbinates Mouth:  o/p injected with cobblestoning Neck:  bilat anterior cervical chain LA Lungs:  Normal respiratory effort, chest expands symmetrically. Lungs are clear to auscultation, no crackles or wheezes.  dry cough Heart:  Normal rate and regular rhythm. S1 and S2 normal without gallop, murmur, click, rub or  other extra sounds. Skin:  color normal.   Psych:  good eye contact, not anxious appearing, and not depressed appearing.     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 Will treat with 5 days of Zithromax + OTC cold meds.   Call if not improved after 1 wk.   Her updated medication list for this problem includes:    Nasonex 50 Mcg/act Susp (Mometasone furoate) .Marland Kitchen... 2 sprays per nostril daily    Zithromax Z-pak 250 Mg Tabs (Azithromycin) .Marland Kitchen... 2 tabs by mouth x 1 day then 1 tab by mouth daily x 4 days  Complete Medication List: 1)  Multivitamins Tabs (Multiple vitamin) .... Take 1 tab daily 2)  Valtrex 1 Gm Tabs (Valacyclovir hcl) .... 1/2 tablet by mouth once daily 3)  Caltrate 600+d 600-400 Mg-unit Tabs (Calcium carbonate-vitamin d) .Marland Kitchen.. 1 once daily 4)  Nasonex 50 Mcg/act Susp (Mometasone furoate) .... 2 sprays per nostril daily 5)  Zithromax Z-pak 250 Mg Tabs (Azithromycin) .... 2 tabs by mouth x 1 day then 1 tab by mouth daily x 4 days  Patient Instructions:  1)  Take 5 days of Zithromax for sinusitis/ bronchitis. 2)  Use Advil Cold and Flu during the day + Theraflu PM at night. 3)  REst, clear fluids. 4)  Call if not resolved after 7-10 days.   Prescriptions: ZITHROMAX Z-PAK 250 MG TABS (AZITHROMYCIN) 2 tabs by mouth x 1 day then 1 tab by mouth daily x 4 days  #1 pack x 0   Entered and Authorized by:   Seymour Bars DO   Signed by:   Seymour Bars DO on 03/11/2010   Method used:   Electronically to        [facility]* (retail)       [address]       [place], [place]  [address]       Ph: [phone]       Fax: [phone]   RxID:   [id number]    Orders Added: 1)  Est. Patient Level III [84132]

## 2010-05-06 NOTE — Progress Notes (Signed)
Summary: Dx codes on lab orders  ---- Converted from flag ---- ---- 08/22/2009 9:40 AM, Seymour Bars DO wrote: Can try to change all 3 to v70.0.  i will print off order and send to the lab to resubmit, but they still may not be covered.    ---- 08/22/2009 9:12 AM, Payton Spark CMA wrote: Pt would like codes changed on TSH, Vit D and CBC from 04/24/09. She states insurance will not pay bc they are not coded as Vcodes. Please advise. ------------------------------  Phone Note Call from Patient   Summary of Call: Codes changed and lab will file charges again.  Initial call taken by: Payton Spark CMA,  Aug 22, 2009 9:50 AM

## 2010-05-06 NOTE — Progress Notes (Signed)
Summary: Nuclear Pre-Procedure  Phone Note Outgoing Call Call back at [facility] Phone [phone]   Call placed by: Stanton Kidney, EMT-P,  May 07, 2009 1:09 PM Action Taken: Phone Call Completed Summary of Call: Left message with information on Myoview Information Sheet (see scanned document for details).     Nuclear Med Background Indications for Stress Test: Evaluation for Ischemia   History: Myocardial Perfusion Study  History Comments: 12/05 MPS: EF=65%, (-) ischemia  Symptoms: DOE    Nuclear Pre-Procedure Cardiac Risk Factors: History of Smoking Height (in): 69

## 2010-05-06 NOTE — Assessment & Plan Note (Signed)
Summary: URI   Vital Signs:  Patient profile:   50 year old female Height:      69 inches Weight:      149 pounds BMI:     22.08 O2 Sat:      99 % on Room air Temp:     98.8 degrees F oral Pulse rate:   62 / minute BP sitting:   130 / 86  (left arm) Cuff size:   regular  Vitals Entered By: Payton Spark CMA (April 08, 2009 10:02 AM)  O2 Flow:  Room air CC: Congestion, chills, achy, fever, ST and cough x 3 days.    Primary Care Provider:  Seymour Bars DO  CC:  Congestion, chills, achy, fever, and ST and cough x 3 days. Marland Kitchen  History of Present Illness: 50 yo WF presents for 3 days of ST, congestion, bodyaches and chills.  She was exposed to strep this past wk.  No GI upset.  No rash.  Voice is hoarse.  Has maiase and bodyaches.   She did have a flu shot this year.   No nasal congestion but has postnasal drip and chest congestion with cough.      She is taking Tylenol.    Current Medications (verified): 1)  Multivitamins  Tabs (Multiple Vitamin) .... Take 1 Tab Daily 2)  Valtrex 1 Gm Tabs (Valacyclovir Hcl) .... 1/2 Tablet By Mouth Once Daily 3)  Ventolin Hfa 108 (90 Base) Mcg/act  Aers (Albuterol Sulfate) .Marland Kitchen.. 1-2 Puffs Every 4-6 Hours As Needed 4)  Caltrate 600+d 600-400 Mg-Unit Tabs (Calcium Carbonate-Vitamin D) .Marland Kitchen.. 1 Once Daily  Allergies (verified): 1)  ! Sulfa 2)  ! * Iv Contrast  Past History:  Past Medical History: Reviewed history from 11/23/2008 and no changes required. 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 Occupation: Air traffic controller, Production designer, theatre/television/film Married Former smoker.  Smoked while in [place].  Only smoked socially. Alcohol use-yes 2 children  Review of Systems      See HPI  Physical Exam  General:   alert, well-developed, well-nourished, and well-hydrated.   Head:  normocephalic and atraumatic.  sinuses NTTP Eyes:  conjunctiva clear Ears:  clear effusion R ear, o/w both are normal Nose:  External nasal examination shows no deformity or inflammation. Nasal mucosa are pink and moist without lesions or exudates. Mouth:  throat injected with clear postnasal drip.  No exudates or vesicles Neck:  bilateral anterior cervical chain LA Lungs:  Normal respiratory effort, chest expands symmetrically. Lungs are clear to auscultation, no crackles or wheezes. Heart:  Normal rate and regular rhythm. S1 and S2 normal without gallop, murmur, click, rub or other extra sounds. Skin:  color normal and no rashes.     Impression & Recommendations:  Problem # 1:  ACUTE PHARYNGITIS (ICD-462) Cover empirically for strep given exposure at home in the past 72 hrs with malaise, hoarseness and ST pain. Recommend OTC Advil Cold and Flu for symptomatic relief, plenty of clear fluids. Call if not better in 7 days. Her updated medication list for this problem includes:    Amoxicillin 500 Mg Caps (Amoxicillin) .Marland Kitchen... 1 capsule by mouth three times a day x 7 days  Complete Medication List: 1)  Multivitamins Tabs (Multiple vitamin) .... Take 1 tab daily  2)  Valtrex 1 Gm Tabs (Valacyclovir hcl) .... 1/2 tablet by mouth once daily 3)  Ventolin Hfa 108 (90 Base) Mcg/act Aers (Albuterol sulfate) .Marland Kitchen.. 1-2 puffs every 4-6 hours as needed 4)  Caltrate 600+d 600-400 Mg-unit Tabs (Calcium carbonate-vitamin d) .Marland Kitchen.. 1 once daily 5)  Amoxicillin 500 Mg Caps (Amoxicillin) .Marland Kitchen.. 1 capsule by mouth three times a day x 7 days  Patient Instructions: 1)  Take 7 days of Amoxicillin for upper resp infection/ strep exposure. 2)  Take OTC Advil Cold and Flu for symptomatic relief. 3)  Take it easy. 4)  Drink plenty of clear fluids. 5)  Call if not better after 7 days. Prescriptions: AMOXICILLIN 500 MG CAPS (AMOXICILLIN) 1 capsule by  mouth three times a day x 7 days  #21 x 0   Entered and Authorized by:   Seymour Bars DO   Signed by:   Seymour Bars DO on 04/08/2009   Method used:   Electronically to        [facility]* (retail)       [address]       [place], [place]  [address]       Ph: [phone]       Fax: [phone]   RxID:   [id number]

## 2010-05-06 NOTE — Assessment & Plan Note (Signed)
Summary: Cardiology Nuclear Study  Nuclear Med Background Indications for Stress Test: Evaluation for Ischemia   History: Myocardial Perfusion Study  History Comments: 12/05 MPS:no ischemia, EF=65%  Symptoms: Chest Tightness, DOE, Palpitations  Symptoms Comments: Last episode of CP:2 weeks ago.   Nuclear Pre-Procedure Cardiac Risk Factors: Family History - CAD, History of Smoking Caffeine/Decaff Intake: None NPO After: 8:00 PM Lungs: Clear IV 0.9% NS with Angio Cath: 22g     IV Site: (R) AC IV Started by: Irean Hong RN Chest Size (in) 36     Cup Size B     Height (in): 69.5 Weight (lb): 143 BMI: 20.89  Nuclear Med Study 1 or 2 day study:  1 day     Stress Test Type:  Stress Reading MD:  Charlton Haws, MD     Referring MD:  Seymour Bars, DO Resting Radionuclide:  Technetium 66m Tetrofosmin     Resting Radionuclide Dose:  11.0 mCi  Stress Radionuclide:  Technetium 28m Tetrofosmin     Stress Radionuclide Dose:  33.0 mCi   Stress Protocol Exercise Time (min):  10:00 min     Max HR:  173 bpm     Predicted Max HR:  171 bpm  Max Systolic BP: 167 mm Hg     Percent Max HR:  101.17 %     METS: 11.7 Rate Pressure Product:  66440    Stress Test Technologist:  Rea College CMA-N     Nuclear Technologist:  Burna Mortimer Deal RT-N  Rest Procedure  Myocardial perfusion imaging was performed at rest 45 minutes following the intravenous administration of Myoview Technetium 20m Tetrofosmin.  Stress Procedure  The patient exercised for ten minutes.  The patient stopped due to fatigue and denied any chest pain.  There were no significant ST-T wave changes, only occasional PVC's and PAC's.  Myoview was injected at peak exercise and myocardial perfusion imaging was performed after a brief delay.  QPS Raw Data Images:  Normal; no motion artifact; normal heart/lung ratio. Stress Images:  NI: Uniform and normal uptake of tracer in all myocardial segments. Rest Images:  Normal homogeneous uptake in  all areas of the myocardium. Subtraction (SDS):  Normal Transient Ischemic Dilatation:  .95  (Normal <1.22)  Lung/Heart Ratio:  .26  (Normal <0.45)  Quantitative Gated Spect Images QGS EDV:  86 ml QGS ESV:  29 ml QGS EF:  67 % QGS cine images:  Normal  Findings Normal nuclear study      Overall Impression  Exercise Capacity: Good exercise capacity. BP Response: Normal blood pressure response. Clinical Symptoms: No chest pain ECG Impression: No significant ST segment change suggestive of ischemia. Overall Impression: Normal stress nuclear study. Overall Impression Comments: Normal

## 2010-08-19 NOTE — Assessment & Plan Note (Signed)
[facility]                         GASTROENTEROLOGY OFFICE NOTE   NAME:APPLEMackena, Dana Price                        MRN:          [id number]  DATE:02/25/2007                            DOB:          Nov 09, 1960    CHIEF COMPLAINT:  Abdominal pain, diarrhea, chronic.  Question other  therapy.   PROBLEMS:  1. Chronic abdominal pain and intermittent diarrhea after ingestion of      food at a dinner party a couple of years ago.  Workup has included      an unrevealing colonoscopy by Dr. Marina Goodell, January 2007, with      unrevealing EGD.  Biopsies negative for celiac disease.  2. A 2-cm left adrenal mass, stable.  3. History of herpes.  4. Migraine headaches.  5. Osteopenia.  6. Probable uterine fibroid.  7. History of anemia, on iron therapy, still menstruating.  8. Negative plasma metanephrines and unrevealing cortisol studies.   INTERVAL HISTORY:  Dana Price has had problems since she went to a  dinner party a number of years ago, where, in December 2006, she ate  some ribs.  She had some diarrhea after that, and then eventually  developed abdominal pain to the point she went to the emergency  department where appendicitis was ruled out with imaging studies.  Subsequently, she saw Dr. Marina Goodell and had the studies above.  Since that  time she has had a chronic problem with this left upper quadrant  discomfort, generally a pulling sort of sensation.  It seems to worsen  and move into the epigastrium at times.  About once a month she will  have spells of diarrhea for half the day and have to take Imodium which  controls it.  The background pain is an issue and sometimes flares a  little bit, and makes her unwilling to do things like exercising and  walking, and is affecting her quality of life.  The diarrhea is not a  major issue.  There are no fevers, chills, skin rash.  She is actually  gaining some weight.  Appetite is good.  She thinks that the attacks of  diarrhea are unpredictable.  She apparently saw Dr. Dionicia Abler in [place]  and received some type of irritable bowel agent at one point, but that  did not help.  She does not remember the name of that.   MEDICATIONS:  1. Valtrex, on hold.  2. Vitamins, on hold.  3. Iron daily.  4. Vitamin E.  5. Fish oil.  6. Calcium.   DRUG ALLERGIES:  SULFA.   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  I have  cared for her husband in the past.  She also works with a friend of  mine.   She has no significant GI family history.   Additional history, the pain sometimes is worse with movement at times.   PHYSICAL EXAMINATION:  Weight 149 pounds, pulse 68, blood pressure  118/78.  BACK:  No CVA tenderness.  LUNGS:  Clear.  HEART:  S1, S2.  No murmurs, rubs, or gallops.  ABDOMEN:  Soft, nontender, no organomegaly or mass.  The ribs are  nontender.  LOWER EXTREMITIES:  Free of edema.  She is alert and oriented x3.   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  That is my best guess at this time.  I do not  think further workup is needed for the time being.  She does have this  history of mild anemia that is probably menorrhagic in origin.   PLAN:  1. CBC with ferritin level.  2. Check C-reactive protein and sed rate as well.  3. Trial of Levbid and Align daily.  After a month she can stop both      and see if she needs Levbid still, or either.  4. Call me back if this is not helpful or schedule a return visit.  5. I do not think small bowel imaging is necessary at this time,      though if I had to do that, I would do a small bowel capsule      endoscopy most likely.  Her diarrhea is about once a month and for      about half a day.  I think that is pretty infrequent.  Note, there      are very rare nocturnal symptoms, so it sounds more like functional      disorder.     Dana Boop, MD,FACG  Electronically  Signed    CEG/MedQ  DD: 02/25/2007  DT: 02/26/2007  Job #: [id number]   cc:   Georgina Quint. Plotnikov, MD

## 2010-08-19 NOTE — Assessment & Plan Note (Signed)
[facility]                            CARDIOLOGY OFFICE NOTE   DARIEN, MIGNOGNA                        MRN:          [id number]  DATE:01/13/2008                            DOB:          [birth year]    Ms. Dana Price is a pleasant 50 year old female who I have seen urgently  today following an intravenous contrast reaction.  The patient was  scheduled for an outpatient CT scan of the abdomen to further evaluate  abdominal pain and to rule out ischemia.  She received barium as well as  100 mL of intravenous contrast.  Immediately following the scan, the  patient developed facial flushing with a tingling sensation within her  throat.  I evaluated the patient and found her to be hemodynamically  stable.  She had no wheezing or stridor, was observed to have a normal  work of breathing.  There was no evidence of tongue swelling.  The  patient did not have rash or hives, however, she did appear to be  flushed.  She, therefore, received Solu-Medrol 80 mg intravenously as  well as oral famotidine.  She was observed for approximately 2 hours  with complete resolution of her symptoms.  She reported returning to her  baseline state of health.  Her blood pressure remains stable and was  120/84 at time of discharge from our office.  She denied shortness of  breath, any sensation of tongue or throat swelling or tingling and was  otherwise without complaint.  On my exam prior to discharge, the patient  appeared to have returned to her usual state of health.  Her facial  flushing had resolved.  Again, there was no evidence of tongue swelling,  stridor, or wheezing.  She had a normal respiratory status and was  otherwise without complaint.  She is discharged to the care of her  husband who has joined her in our office.  She would like to return to  work this afternoon.  I have advised the patient to be vigilant for  signs of significant dizziness, orthostasis, presyncope,  shortness of  breath, further tongue swelling, or throat swelling.  She is aware to  present to the emergency department immediately should that happen.  She  will follow up with the office of Dr. Yancey Flemings for results of her CT  scan as previously ordered.     Hillis Range, MD  Electronically Signed    JA/MedQ  DD: 01/13/2008  DT: 01/14/2008  Job #: [id number]   cc:   Rollene Rotunda, MD, [facility]

## 2010-08-19 NOTE — Assessment & Plan Note (Signed)
[facility]                         GASTROENTEROLOGY OFFICE NOTE   NAME:APPLEAyshia, Gramlich                        MRN:          [id number]  DATE:06/14/2007                            DOB:          25-Jan-1961    CHIEF COMPLAINT:  Left upper quadrant pain.   Anapaula has persistent left upper quadrant pain. I put her on Levbid when  I saw her last and that did not really help her and gave her  palpitations as well. She took a trial of Align and felt that probably  helped some but she is not on that now. The pain has persisted and she  feels like she just has to find out what the cause is. There has been an  extensive workup in the past which is outlined in my chart and we  reviewed that again. She has had CTs, MRIs of the abdomen as well as an  MRI of the thoracic spine that had really been unrevealing. She has had  iron deficiency anemia problems and a recent placement of a IUD seems to  have helped with menorrhagia. She is not disabled by the pooling  sensation in the left upper quadrant but again whenever it starts up it  seems to raise questions in her mind as to what the cause is. She denies  fever or chills or other constitutional symptoms. There has not been any  particular weight loss problem. Work is stress but it has always been  that way.   She really has had the same feeling for over 2 years, it never goes  away. She has a popping sensation in the left upper quadrant at times.  When she awakens in the morning and she breathes, she feels like it is a  sponge in her left posterior lung area. She is not having any other  shortness of breath or breathing difficulty but she feels like she needs  to clear her throat and has a dry cough at times. Recently she went to  [facility] in [place], her daughter had mononucleosis and strep and  she was tested because she was not feeling well or she asked them about  this pain. In fact, she has been to  numerous physicians including her  husband's primary care physician, Dr. Olivia Canter. She has been to Dr.  Sandria Manly of neurology. At [facility], she had an amylase of 110 with top  normal 99, but a normal lipase. Her sodium was 134 though normal is 135-  145. She had negative H. pylori  antibodies. She also had a CBC with  elevated platelets at 420,000 with top normal 400. The remainder of her  CBC was normal including a hemoglobin of 12.9, hematocrit 37 and MCV of  88.   MEDICATIONS:  Listed and reviewed in the chart.   ALLERGIES:  Listed and reviewed.   PAST MEDICAL HISTORY:  Reviewed and unchanged from February 25, 2007.   PHYSICAL EXAMINATION:  Well-developed, no acute distress, well-  nourished, white woman, appearing her stated age or younger.  Weight 148 pounds, pulse 72, blood pressure 112/70.  LUNGS:  Clear. Resonant to percussion. There is no CVA tenderness. The  ribs are nontender. Under deep palpation in the left upper quadrant, she  feels some pressure but there is no particular abnormality otherwise.   ASSESSMENT:  1. Chronic left upper quadrant pain of unclear etiology. I explained      to Dennise that multiple evaluations have failed to reveal any      serious health problem here. I realize this is bothering her and      the symptoms are real to her, but it is going to be unlikely we      will ever be able to tell her what the cause is. We probably will      only be able to tell her what it is not. Having said that, she has      never had celiac testing and she has some osteopenia and anemia and      despite normal duodenal biopsies at a previous EGD, it is not      unreasonable to check these antibodies. I am going to check a      tissue transglutaminase antibody and IgA level.(these were normal)  2. Mildly elevated amylase likely clinically inconsequential and not      significant. Elevated amylase has been seen (macroamylasemia) in      celiac disease, so that is  other amplifications for checking the      celiac antibodies. Recheck an amylase and lipase as well. I do not      think any pancreatic imaging is warranted based upon the CT scan      and the MR she has had in the past. The chronicity of the symptoms      and her otherwise good status speak to the benign nature of      whatever the cause of these symptoms is in my mind. I tried to      reassure her as best I could. We will contact her with the results.   Note, she is going to see Dr. Mariel Sleet, hematologist, tomorrow. Her  husband is personally friendly with Dr. Mariel Sleet and the physicians at  [facility] had told her, because of the thrombocytosis, that she should  see a hematologist.     Iva Boop, MD,FACG  Electronically Signed    CEG/MedQ  DD: 06/14/2007  DT: 06/14/2007  Job #: [id number]   cc:   Corwin Levins, MD  [facility]. Mariel Sleet, MD

## 2010-08-22 NOTE — Op Note (Signed)
NAME:  Dana Price, Dana Price                 ACCOUNT NO.:  [id number]   MEDICAL RECORD NO.:  [id number]          PATIENT TYPE:  AMB   LOCATION:  [facility]                          FACILITY:  [facility]   PHYSICIAN:  Adolph Pollack, M.D.DATE OF BIRTH:  09-21-1960   DATE OF PROCEDURE:  04/14/2005  DATE OF DISCHARGE:                                 OPERATIVE REPORT   PREOPERATIVE DIAGNOSIS:  Enlarging right arm soft tissue mass.   POSTOPERATIVE DIAGNOSIS:  Enlarging right arm soft tissue mass.   PROCEDURE:  Excision of 3 cm  right arm soft tissue mass.   SURGEON:  Adolph Pollack, M.D.   ANESTHESIA:  Lidocaine 1% mixed with 0.5% plain Marcaine.   INDICATION:  [name] is a 50 year old female who had noticed a soft tissue  mass in the right upper arm, that has been enlarging. For this reason, she  presents for removal. We discussed the procedure and the risks  preoperatively.   TECHNIQUE:  She is brought to the minor procedure room and placed supine on  the table. The mass was marked with a marking pen. The area was sterilely  prepped and draped. Local anesthetic was infiltrated over the mass in a  longitudinal fashion.  A longitudinal incision was made directly over the  mesh, through the skin, dermis and subcutaneous tissue until the mass was  exposed. It appeared to be a lipomatous mass. Using careful blunt and sharp  dissection, I was able to excise the mass in one large fragment; and then  there was a residual fragment, which was also removed. The mass measured  approximately 3 cm. It was sent to pathology.   I used direct pressure for hemostasis; and once hemostasis was adequate, I  closed the skin with a running  4-0 Monocryl subcuticular stitch. Steri-  Strips and sterile dressing were applied.   She tolerated the procedure well without any apparent complications.  She  was discharged in satisfactory condition with discharge instructions.      Adolph Pollack, M.D.  Electronically Signed    TJR/MEDQ  D:  04/14/2005  T:  04/14/2005  Job:  [id number]   cc:   Georgina Quint. Plotnikov, M.D. [facility]  [address]  [place]  [place] [address]

## 2010-10-10 ENCOUNTER — Encounter: Payer: Self-pay | Admitting: Cardiovascular Disease

## 2010-12-25 ENCOUNTER — Other Ambulatory Visit: Payer: Self-pay | Admitting: Obstetrics

## 2010-12-25 DIAGNOSIS — Z1231 Encounter for screening mammogram for malignant neoplasm of breast: Secondary | ICD-10-CM

## 2010-12-29 LAB — COMPREHENSIVE METABOLIC PANEL
ALT: 16
Albumin: 4.2
Alkaline Phosphatase: 38 — ABNORMAL LOW
Chloride: 102
Glucose, Bld: 97
Potassium: 3.7
Sodium: 137
Total Bilirubin: 0.7
Total Protein: 7.5

## 2010-12-29 LAB — IRON AND TIBC
Iron: 97
Saturation Ratios: 27
UIBC: 261

## 2010-12-29 LAB — CBC
Hemoglobin: 14.1
RDW: 13.1
WBC: 8.5

## 2010-12-29 LAB — PROTEIN ELECTROPH W RFLX QUANT IMMUNOGLOBULINS
Alpha-1-Globulin: 3.5
Alpha-2-Globulin: 8.9
Beta Globulin: 6.3
M-Spike, %: NOT DETECTED
Total Protein ELP: 7.6

## 2010-12-29 LAB — DIFFERENTIAL
Basophils Absolute: 0
Eosinophils Absolute: 0
Lymphocytes Relative: 18
Lymphs Abs: 1.5
Neutrophils Relative %: 77

## 2010-12-29 LAB — IMMUNOFIXATION ELECTROPHORESIS
IgA: 151
IgG (Immunoglobin G), Serum: 1330
IgM, Serum: 75

## 2010-12-29 LAB — C-REACTIVE PROTEIN: CRP: 0.2 — ABNORMAL LOW (ref <0.6)

## 2010-12-29 LAB — SEDIMENTATION RATE: Sed Rate: 8

## 2011-01-20 ENCOUNTER — Ambulatory Visit
Admission: RE | Admit: 2011-01-20 | Discharge: 2011-01-20 | Disposition: A | Discharge status: Home / Self Care | Payer: Self-pay | Source: Ambulatory Visit | Attending: Obstetrics | Admitting: Obstetrics

## 2011-01-20 DIAGNOSIS — Z1231 Encounter for screening mammogram for malignant neoplasm of breast: Secondary | ICD-10-CM

## 2011-12-29 ENCOUNTER — Other Ambulatory Visit: Payer: Self-pay | Admitting: Obstetrics

## 2011-12-29 DIAGNOSIS — Z1231 Encounter for screening mammogram for malignant neoplasm of breast: Secondary | ICD-10-CM

## 2012-02-09 ENCOUNTER — Ambulatory Visit

## 2012-02-23 ENCOUNTER — Ambulatory Visit (INDEPENDENT_AMBULATORY_CARE_PROVIDER_SITE_OTHER)

## 2012-02-23 DIAGNOSIS — Z1231 Encounter for screening mammogram for malignant neoplasm of breast: Secondary | ICD-10-CM

## 2013-01-25 ENCOUNTER — Other Ambulatory Visit: Payer: Self-pay | Admitting: Family Medicine

## 2013-01-25 DIAGNOSIS — Z1231 Encounter for screening mammogram for malignant neoplasm of breast: Secondary | ICD-10-CM

## 2013-02-28 ENCOUNTER — Ambulatory Visit (INDEPENDENT_AMBULATORY_CARE_PROVIDER_SITE_OTHER)

## 2013-02-28 DIAGNOSIS — Z1231 Encounter for screening mammogram for malignant neoplasm of breast: Secondary | ICD-10-CM

## 2014-01-26 ENCOUNTER — Other Ambulatory Visit: Payer: Self-pay | Admitting: Family Medicine

## 2014-01-26 DIAGNOSIS — Z1231 Encounter for screening mammogram for malignant neoplasm of breast: Secondary | ICD-10-CM

## 2014-03-07 ENCOUNTER — Ambulatory Visit (INDEPENDENT_AMBULATORY_CARE_PROVIDER_SITE_OTHER)

## 2014-03-07 DIAGNOSIS — Z1231 Encounter for screening mammogram for malignant neoplasm of breast: Secondary | ICD-10-CM

## 2014-08-08 ENCOUNTER — Encounter: Payer: Self-pay | Admitting: Internal Medicine

## 2015-01-28 ENCOUNTER — Other Ambulatory Visit: Payer: Self-pay | Admitting: Family Medicine

## 2015-01-28 DIAGNOSIS — Z1231 Encounter for screening mammogram for malignant neoplasm of breast: Secondary | ICD-10-CM

## 2015-03-13 ENCOUNTER — Ambulatory Visit (INDEPENDENT_AMBULATORY_CARE_PROVIDER_SITE_OTHER)

## 2015-03-13 DIAGNOSIS — Z1231 Encounter for screening mammogram for malignant neoplasm of breast: Secondary | ICD-10-CM | POA: Diagnosis not present

## 2016-03-06 ENCOUNTER — Other Ambulatory Visit: Payer: Self-pay | Admitting: Family Medicine

## 2016-03-06 DIAGNOSIS — Z1231 Encounter for screening mammogram for malignant neoplasm of breast: Secondary | ICD-10-CM

## 2016-04-14 ENCOUNTER — Ambulatory Visit

## 2016-04-17 ENCOUNTER — Ambulatory Visit
Admission: RE | Admit: 2016-04-17 | Discharge: 2016-04-17 | Disposition: A | Discharge status: Home / Self Care | Source: Ambulatory Visit | Attending: Family Medicine | Admitting: Family Medicine

## 2016-04-17 DIAGNOSIS — Z1231 Encounter for screening mammogram for malignant neoplasm of breast: Secondary | ICD-10-CM

## 2016-05-06 ENCOUNTER — Ambulatory Visit (INDEPENDENT_AMBULATORY_CARE_PROVIDER_SITE_OTHER): Admitting: Podiatry

## 2016-05-06 ENCOUNTER — Ambulatory Visit (INDEPENDENT_AMBULATORY_CARE_PROVIDER_SITE_OTHER)

## 2016-05-06 ENCOUNTER — Encounter: Payer: Self-pay | Admitting: Podiatry

## 2016-05-06 VITALS — BP 144/92 | HR 62 | Resp 16 | Ht 68.0 in | Wt 162.0 lb

## 2016-05-06 DIAGNOSIS — M21619 Bunion of unspecified foot: Secondary | ICD-10-CM

## 2016-05-06 DIAGNOSIS — M722 Plantar fascial fibromatosis: Secondary | ICD-10-CM

## 2016-05-06 DIAGNOSIS — M779 Enthesopathy, unspecified: Secondary | ICD-10-CM | POA: Diagnosis not present

## 2016-05-06 MED ORDER — TRIAMCINOLONE ACETONIDE 10 MG/ML IJ SUSP
10.0000 mg | Freq: Once | INTRAMUSCULAR | Status: AC
  Administered 2016-05-06: 10 mg

## 2016-05-06 NOTE — Patient Instructions (Signed)

## 2016-05-06 NOTE — Progress Notes (Signed)
   Subjective:    Patient ID: Dana Price, female    DOB: 08-25-1960, 56 y.o.   MRN: [id number]  HPI  Chief Complaint  Patient presents with  . Bunions    Left foot; Denies Pain.   . Foot Pain    Left; Bottom of heel x 1.5 month.   . Foot Pain    Left; Plantar Forefoot x 8 months. Pt states that it "feels as if the bones in her foot hit the ground when she walks"       Review of Systems     Objective:   Physical Exam        Assessment & Plan:

## 2016-05-06 NOTE — Progress Notes (Signed)
Subjective:     Patient ID: Dana Price, female   DOB: [birth year], 56 y.o.   MRN: [id number]  HPI patient states she's had moderate deformity in the left forefoot with chronic pain and is developed severe heel pain over the last month with pain worse when she gets up in the morning or after sitting   Review of Systems  All other systems reviewed and are negative.      Objective:   Physical Exam  Constitutional: She is oriented to person, place, and time.  Cardiovascular: Intact distal pulses.   Musculoskeletal: Normal range of motion.  Neurological: She is oriented to person, place, and time.  Skin: Skin is warm.  Nursing note and vitals reviewed.  neurovascular status intact muscle strength adequate range of motion within normal limits with elevated rigid contracture digit 2 left with family history of this that she states is occurred over the last year. She has a prominent second metatarsal left which is most likely due to the deformity of the metatarsal joint and has exquisite discomfort plantar heel when palpated. Found have good digital perfusion and is well oriented     Assessment:     Possible flexor plate damage with plantarflexed second metatarsal and acute plantar fasciitis left    Plan:     H&P x-ray reviewed and today I went ahead and I injected the plantar fascial left 3 mg Kenalog 5 mill grams Xylocaine and applied fascial brace gave instructions on physical therapy and discussed long-term orthotics to control the forefoot instability. Explain someday she may require digital shortening with possible metatarsal osteotomy and I reviewed that fact with patient  X-ray report indicated there is medial and dorsal dislocation second toe and small spur formation heel

## 2016-05-27 ENCOUNTER — Ambulatory Visit: Admitting: Podiatry

## 2016-06-04 ENCOUNTER — Ambulatory Visit: Admitting: Podiatry

## 2016-06-19 ENCOUNTER — Encounter: Payer: Self-pay | Admitting: Podiatry

## 2016-06-19 ENCOUNTER — Ambulatory Visit (INDEPENDENT_AMBULATORY_CARE_PROVIDER_SITE_OTHER): Admitting: Podiatry

## 2016-06-19 DIAGNOSIS — M779 Enthesopathy, unspecified: Secondary | ICD-10-CM

## 2016-06-19 DIAGNOSIS — M722 Plantar fascial fibromatosis: Secondary | ICD-10-CM

## 2016-06-19 MED ORDER — TRIAMCINOLONE ACETONIDE 10 MG/ML IJ SUSP
10.0000 mg | Freq: Once | INTRAMUSCULAR | Status: AC
  Administered 2016-06-19: 10 mg

## 2016-06-21 NOTE — Progress Notes (Signed)
Subjective:     Patient ID: Dana Price, female   DOB: Apr 03, 1961, 56 y.o.   MRN: [id number]  HPI patient states that her heel has been feeling quite a bit better but she still has a lot of pain in the forefoot that we had not treated it previous visit   Review of Systems     Objective:   Physical Exam Neurovascular status intact negative Homans sign noted with patient found to have acute inflammation second MPJ left with fluid within the joint and pain when palpated with medial and dorsal dislocation of the toe. Patient's heel has pain but is improved    Assessment:     Capsulitis second MPJ left which may be due to flexor plate imbalance or possible stretch or tear. Patient's noted to have discomfort in the plantar fascial    Plan:     H&P conditions reviewed and at this time I went ahead and I did do a proximal nerve block left forefoot aspirated the second MPJ getting out of small amount of clear fluid and injected with half cc of dexamethasone Kenalog. I then discussed her heel and she'll continue with physical therapy discussed orthotics and the possibility for surgical intervention due to the flexor plate situation and we will see the response of her 2 weeks and decide

## 2016-07-02 ENCOUNTER — Ambulatory Visit (INDEPENDENT_AMBULATORY_CARE_PROVIDER_SITE_OTHER): Admitting: Podiatry

## 2016-07-02 ENCOUNTER — Encounter: Payer: Self-pay | Admitting: Podiatry

## 2016-07-02 DIAGNOSIS — M21619 Bunion of unspecified foot: Secondary | ICD-10-CM | POA: Diagnosis not present

## 2016-07-02 DIAGNOSIS — M722 Plantar fascial fibromatosis: Secondary | ICD-10-CM | POA: Diagnosis not present

## 2016-07-02 DIAGNOSIS — M779 Enthesopathy, unspecified: Secondary | ICD-10-CM

## 2016-07-03 NOTE — Progress Notes (Signed)
Subjective:     Patient ID: Dana Price, female   DOB: 08/14/1960, 56 y.o.   MRN: [id number]  HPI patient presents stating that my left foot is quite a bit improved but the padding really helps and I know I need orthotics   Review of Systems     Objective:   Physical Exam Neurovascular status intact with patient found to have moderate discomfort around the second MPJ left and continues to have rigid contracture digit 2 left with structural bunion deformity    Assessment:     Inflammatory capsulitis second MPJ left secondary to foot structure    Plan:     H&P x-ray reviewed and at this time I recommended orthotics to try to do spell weight off this joint surface. Worked with the pedicle orthotist and we scanned for a type orthotic to try to reduce pressure

## 2016-07-27 ENCOUNTER — Other Ambulatory Visit

## 2017-03-17 ENCOUNTER — Other Ambulatory Visit: Payer: Self-pay | Admitting: Family Medicine

## 2017-03-17 DIAGNOSIS — Z1231 Encounter for screening mammogram for malignant neoplasm of breast: Secondary | ICD-10-CM

## 2017-04-22 ENCOUNTER — Ambulatory Visit
Admission: RE | Admit: 2017-04-22 | Discharge: 2017-04-22 | Disposition: A | Discharge status: Home / Self Care | Source: Ambulatory Visit | Attending: Family Medicine | Admitting: Family Medicine

## 2017-04-22 DIAGNOSIS — Z1231 Encounter for screening mammogram for malignant neoplasm of breast: Secondary | ICD-10-CM

## 2017-04-23 ENCOUNTER — Other Ambulatory Visit: Payer: Self-pay | Admitting: Family Medicine

## 2017-04-23 DIAGNOSIS — R928 Other abnormal and inconclusive findings on diagnostic imaging of breast: Secondary | ICD-10-CM

## 2017-04-27 ENCOUNTER — Ambulatory Visit: Admission: RE | Admit: 2017-04-27 | Source: Ambulatory Visit

## 2017-04-27 ENCOUNTER — Ambulatory Visit
Admission: RE | Admit: 2017-04-27 | Discharge: 2017-04-27 | Disposition: A | Discharge status: Home / Self Care | Source: Ambulatory Visit | Attending: Family Medicine | Admitting: Family Medicine

## 2017-04-27 DIAGNOSIS — R928 Other abnormal and inconclusive findings on diagnostic imaging of breast: Secondary | ICD-10-CM

## 2018-03-24 ENCOUNTER — Other Ambulatory Visit: Payer: Self-pay | Admitting: Family Medicine

## 2018-03-24 DIAGNOSIS — Z1231 Encounter for screening mammogram for malignant neoplasm of breast: Secondary | ICD-10-CM

## 2018-04-28 ENCOUNTER — Encounter: Payer: Self-pay | Admitting: Internal Medicine

## 2018-05-02 ENCOUNTER — Ambulatory Visit
Admission: RE | Admit: 2018-05-02 | Discharge: 2018-05-02 | Disposition: A | Discharge status: Home / Self Care | Source: Ambulatory Visit | Attending: Family Medicine | Admitting: Family Medicine

## 2018-05-02 DIAGNOSIS — Z1231 Encounter for screening mammogram for malignant neoplasm of breast: Secondary | ICD-10-CM

## 2018-05-11 ENCOUNTER — Encounter: Payer: Self-pay | Admitting: Internal Medicine

## 2018-05-31 ENCOUNTER — Encounter

## 2018-06-02 ENCOUNTER — Ambulatory Visit (AMBULATORY_SURGERY_CENTER): Payer: Self-pay | Admitting: *Deleted

## 2018-06-02 VITALS — Ht 68.0 in | Wt 162.0 lb

## 2018-06-02 DIAGNOSIS — Z1211 Encounter for screening for malignant neoplasm of colon: Secondary | ICD-10-CM

## 2018-06-02 DIAGNOSIS — Z800 Family history of malignant neoplasm of digestive organs: Secondary | ICD-10-CM

## 2018-06-02 NOTE — Progress Notes (Signed)
Patient denies any allergies to eggs or soy. Patient denies any problems with anesthesia/sedation. Patient denies any oxygen use at home. Patient denies taking any diet/weight loss medications or blood thinners. EMMI education assisgned to patient on colonoscopy, this was explained and instructions given to patient. 

## 2018-06-13 ENCOUNTER — Telehealth: Payer: Self-pay | Admitting: Internal Medicine

## 2018-06-13 NOTE — Telephone Encounter (Signed)
No charge. 

## 2018-06-14 ENCOUNTER — Encounter: Admitting: Internal Medicine

## 2018-06-16 ENCOUNTER — Encounter: Payer: Self-pay | Admitting: Internal Medicine

## 2018-06-16 ENCOUNTER — Other Ambulatory Visit: Payer: Self-pay

## 2018-06-16 ENCOUNTER — Ambulatory Visit (AMBULATORY_SURGERY_CENTER): Admitting: Internal Medicine

## 2018-06-16 VITALS — BP 109/67 | HR 54 | Temp 98.0°F | Resp 12 | Ht 68.0 in | Wt 162.0 lb

## 2018-06-16 DIAGNOSIS — Z1211 Encounter for screening for malignant neoplasm of colon: Secondary | ICD-10-CM | POA: Diagnosis present

## 2018-06-16 DIAGNOSIS — K635 Polyp of colon: Secondary | ICD-10-CM

## 2018-06-16 DIAGNOSIS — K5140 Inflammatory polyps of colon without complications: Secondary | ICD-10-CM

## 2018-06-16 DIAGNOSIS — D125 Benign neoplasm of sigmoid colon: Secondary | ICD-10-CM

## 2018-06-16 DIAGNOSIS — K5520 Angiodysplasia of colon without hemorrhage: Secondary | ICD-10-CM | POA: Insufficient documentation

## 2018-06-16 HISTORY — DX: Angiodysplasia of colon without hemorrhage: K55.20

## 2018-06-16 MED ORDER — SODIUM CHLORIDE 0.9 % IV SOLN: 500.0000 mL | Freq: Once | INTRAVENOUS | Status: DC

## 2018-06-16 NOTE — Op Note (Signed)
[facility] Patient Name: Dana Price Procedure Date: 06/16/2018 2:45 PM MRN: [id number] Endoscopist: Gatha Mayer , MD Age: 58 Referring MD:  Date of Birth: 12-09-1960 Gender: Female Account #: [id number] Procedure:                Colonoscopy Indications:              Screening for colorectal malignant neoplasm, Last                            colonoscopy: 2009 Medicines:                Propofol per Anesthesia, Monitored Anesthesia Care Procedure:                Pre-Anesthesia Assessment:                           - Prior to the procedure, a History and Physical                            was performed, and patient medications and                            allergies were reviewed. The patient's tolerance of                            previous anesthesia was also reviewed. The risks                            and benefits of the procedure and the sedation                            options and risks were discussed with the patient.                            All questions were answered, and informed consent                            was obtained. Prior Anticoagulants: The patient has                            taken no previous anticoagulant or antiplatelet                            agents. ASA Grade Assessment: II - A patient with                            mild systemic disease. After reviewing the risks                            and benefits, the patient was deemed in                            satisfactory condition to undergo the procedure.  After obtaining informed consent, the colonoscope                            was passed under direct vision. Throughout the                            procedure, the patient's blood pressure, pulse, and                            oxygen saturations were monitored continuously. The                            Colonoscope was introduced through the anus and                            advanced to the the  cecum, identified by                            appendiceal orifice and ileocecal valve. The                            quality of the bowel preparation was excellent. The                            ileocecal valve, appendiceal orifice, and rectum                            were photographed. The bowel preparation used was                            Miralax. The colonoscopy was performed without                            difficulty. The patient tolerated the procedure                            well. Scope In: 2:55:45 PM Scope Out: 3:12:18 PM Scope Withdrawal Time: 0 hours 11 minutes 37 seconds  Total Procedure Duration: 0 hours 16 minutes 33 seconds  Findings:                 The perianal and digital rectal examinations were                            normal.                           A 10 mm polyp was found in the proximal sigmoid                            colon. The polyp was pedunculated. The polyp was                            removed with a hot snare. Resection and retrieval  were complete. Verification of patient                            identification for the specimen was done. Estimated                            blood loss: none.                           A 4 mm polyp was found in the distal sigmoid colon.                            The polyp was sessile. The polyp was removed with a                            cold snare. Resection and retrieval were complete.                            Verification of patient identification for the                            specimen was done. Estimated blood loss was minimal.                           A single angiodysplastic lesion without bleeding                            was found in the cecum.                           The exam was otherwise without abnormality on                            direct and retroflexion views. Complications:            No immediate complications. Estimated Blood Loss:      Estimated blood loss was minimal. Impression:               - One 10 mm polyp in the proximal sigmoid colon,                            removed with a hot snare. Resected and retrieved.                           - One 4 mm polyp in the distal sigmoid colon,                            removed with a cold snare. Resected and retrieved.                           - A single non-bleeding colonic angiodysplastic                            lesion. Cecum. This could lead to iron-deficiency  anemia in future or bleed easily if on                            anti-coagulants for any reason. If so could ablate                            with APC (hospital procedure)                           - The examination was otherwise normal on direct                            and retroflexion views. Recommendation:           - Patient has a contact number available for                            emergencies. The signs and symptoms of potential                            delayed complications were discussed with the                            patient. Return to normal activities tomorrow.                            Written discharge instructions were provided to the                            patient.                           - Resume previous diet.                           - Continue present medications.                           - No aspirin, ibuprofen, naproxen, or other                            non-steroidal anti-inflammatory drugs for 2 weeks                            after polyp removal.                           - Repeat colonoscopy is recommended. The                            colonoscopy date will be determined after pathology                            results from today's exam become available for  review. Gatha Mayer, MD 06/16/2018 3:25:28 PM This report has been signed electronically.

## 2018-06-16 NOTE — Progress Notes (Signed)
Pt's states no medical or surgical changes since previsit or office visit. 

## 2018-06-16 NOTE — Progress Notes (Signed)
A and O x3. Report to RN. Tolerated MAC anesthesia well.

## 2018-06-16 NOTE — Patient Instructions (Addendum)
I found and removed 2 polyps today. I will let you know pathology results and when to have another routine colonoscopy by mail and/or My Chart.  You also have an AVM or angiodysplasia lesion - these sometimes bleed but we leave them alone unless we know they are causing problems.  I appreciate the opportunity to care for you. Gatha Mayer, MD, [facility]  **Handout given on polyps**   YOU HAD AN ENDOSCOPIC PROCEDURE TODAY AT [facility]:   Refer to the procedure report that was given to you for any specific questions about what was found during the examination.  If the procedure report does not answer your questions, please call your gastroenterologist to clarify.  If you requested that your care partner not be given the details of your procedure findings, then the procedure report has been included in a sealed envelope for you to review at your convenience later.  YOU SHOULD EXPECT: Some feelings of bloating in the abdomen. Passage of more gas than usual.  Walking can help get rid of the air that was put into your GI tract during the procedure and reduce the bloating. If you had a lower endoscopy (such as a colonoscopy or flexible sigmoidoscopy) you may notice spotting of blood in your stool or on the toilet paper. If you underwent a bowel prep for your procedure, you may not have a normal bowel movement for a few days.  Please Note:  You might notice some irritation and congestion in your nose or some drainage.  This is from the oxygen used during your procedure.  There is no need for concern and it should clear up in a day or so.  SYMPTOMS TO REPORT IMMEDIATELY:   Following lower endoscopy (colonoscopy or flexible sigmoidoscopy):  Excessive amounts of blood in the stool  Significant tenderness or worsening of abdominal pains  Swelling of the abdomen that is new, acute  Fever of 100F or higher   For urgent or emergent issues, a gastroenterologist can be reached at  any hour by calling [phone].   DIET:  We do recommend a small meal at first, but then you may proceed to your regular diet.  Drink plenty of fluids but you should avoid alcoholic beverages for 24 hours.  ACTIVITY:  You should plan to take it easy for the rest of today and you should NOT DRIVE or use heavy machinery until tomorrow (because of the sedation medicines used during the test).    FOLLOW UP: Our staff will call the number listed on your records the next business day following your procedure to check on you and address any questions or concerns that you may have regarding the information given to you following your procedure. If we do not reach you, we will leave a message.  However, if you are feeling well and you are not experiencing any problems, there is no need to return our call.  We will assume that you have returned to your regular daily activities without incident.  If any biopsies were taken you will be contacted by phone or by letter within the next 1-3 weeks.  Please call us at [phone] if you have not heard about the biopsies in 3 weeks.    SIGNATURES/CONFIDENTIALITY: You and/or your care partner have signed paperwork which will be entered into your electronic medical record.  These signatures attest to the fact that that the information above on your After Visit Summary has been reviewed and is  understood.  Full responsibility of the confidentiality of this discharge information lies with you and/or your care-partner. 

## 2018-06-17 ENCOUNTER — Telehealth: Payer: Self-pay | Admitting: *Deleted

## 2018-06-17 ENCOUNTER — Telehealth: Payer: Self-pay

## 2018-06-17 NOTE — Telephone Encounter (Signed)
Left message on f/u call 

## 2018-06-17 NOTE — Telephone Encounter (Signed)
Second follow up call attempt.  Message left to call with any questions or concerns.

## 2018-06-24 ENCOUNTER — Encounter: Payer: Self-pay | Admitting: Internal Medicine

## 2018-06-24 NOTE — Progress Notes (Signed)
Inflammatory polyps Recall 2030

## 2019-01-28 IMAGING — MG DIGITAL SCREENING BILATERAL MAMMOGRAM WITH TOMO AND CAD
8 series · 8 of 24 positions shown · non-contrast
Comparison: Previous exam(s).

CLINICAL DATA: Screening.

EXAM:
DIGITAL SCREENING BILATERAL MAMMOGRAM WITH TOMO AND CAD

[L MLO synth-2D]
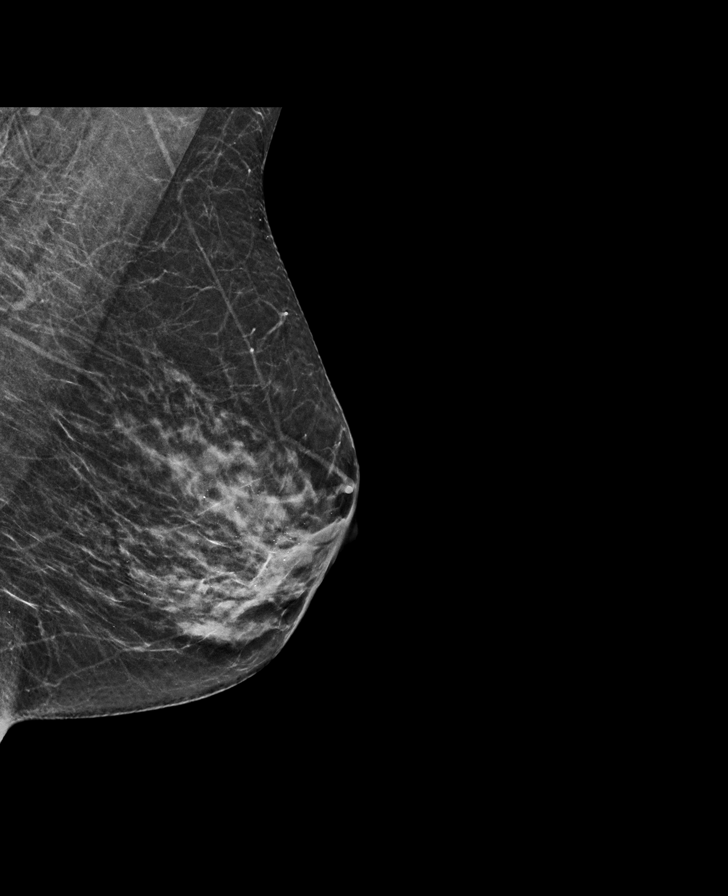

[R MLO synth-2D]
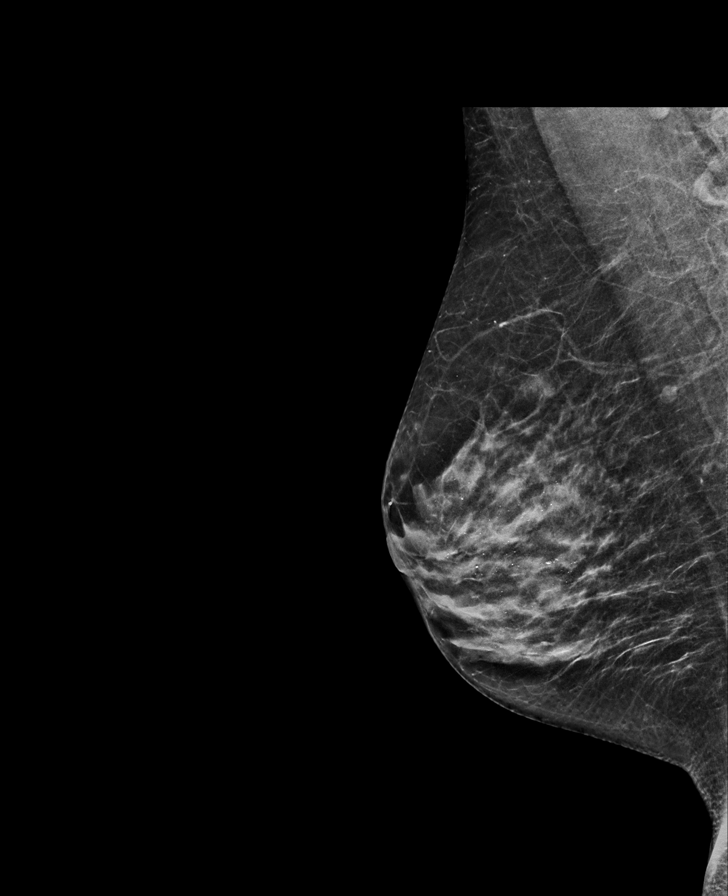

[L CC synth-2D]
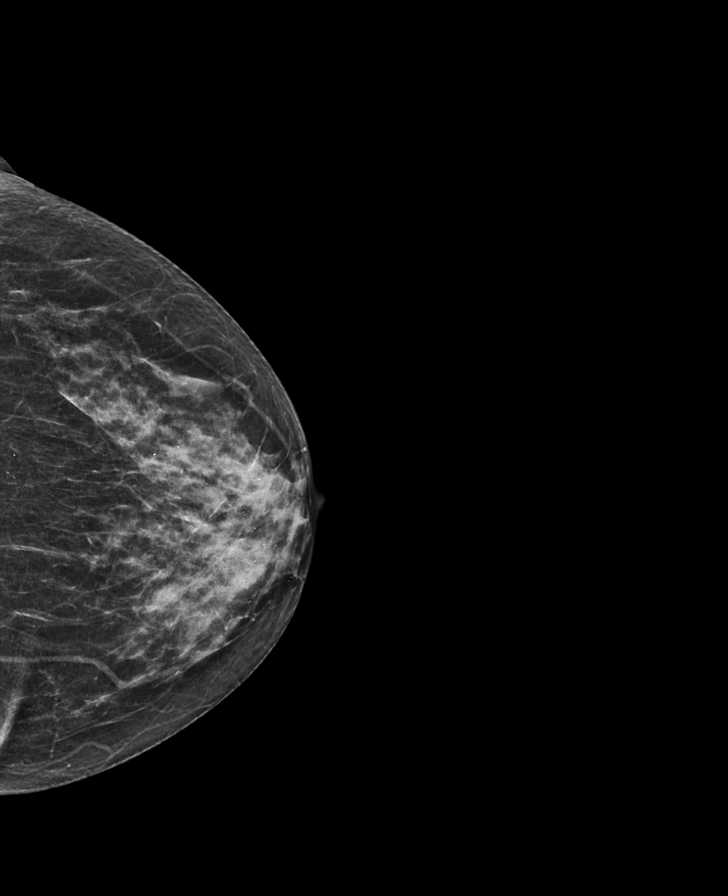

[R CC synth-2D]
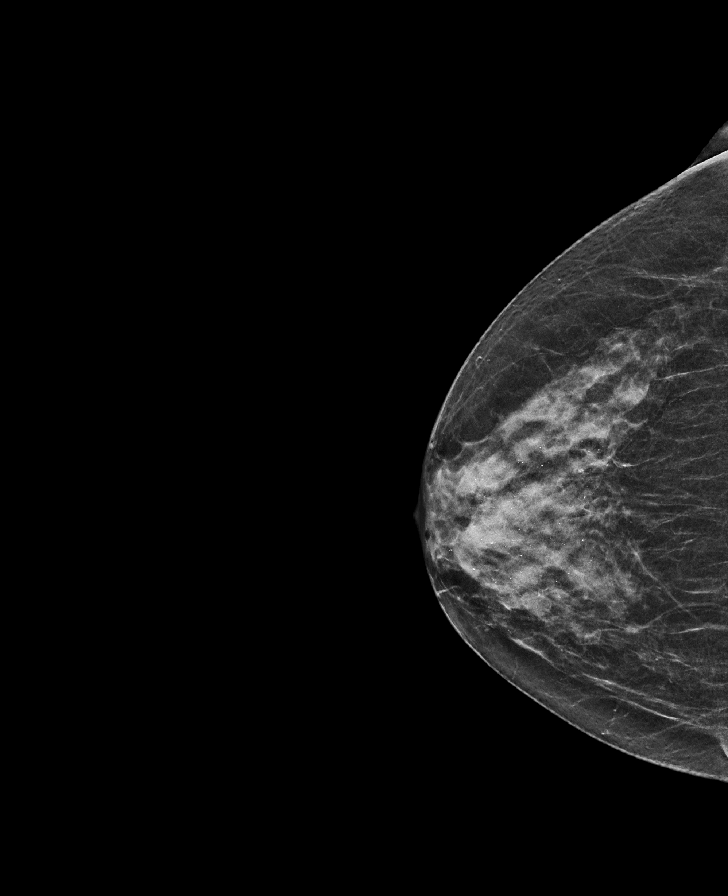

[L CC tomo · tomo slice 27/54.0]
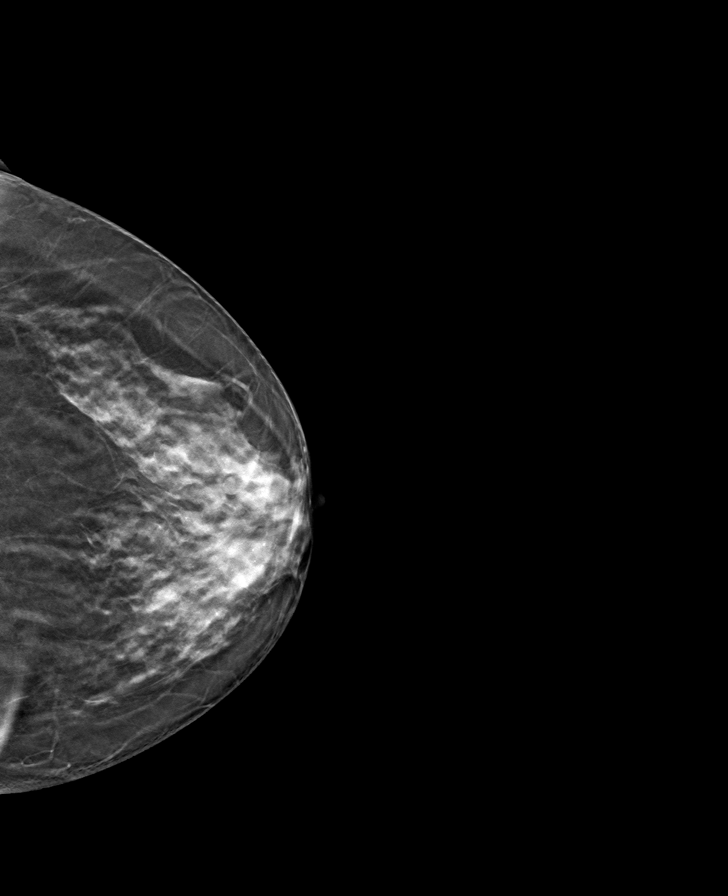

[R MLO tomo · tomo slice 28/55.0]
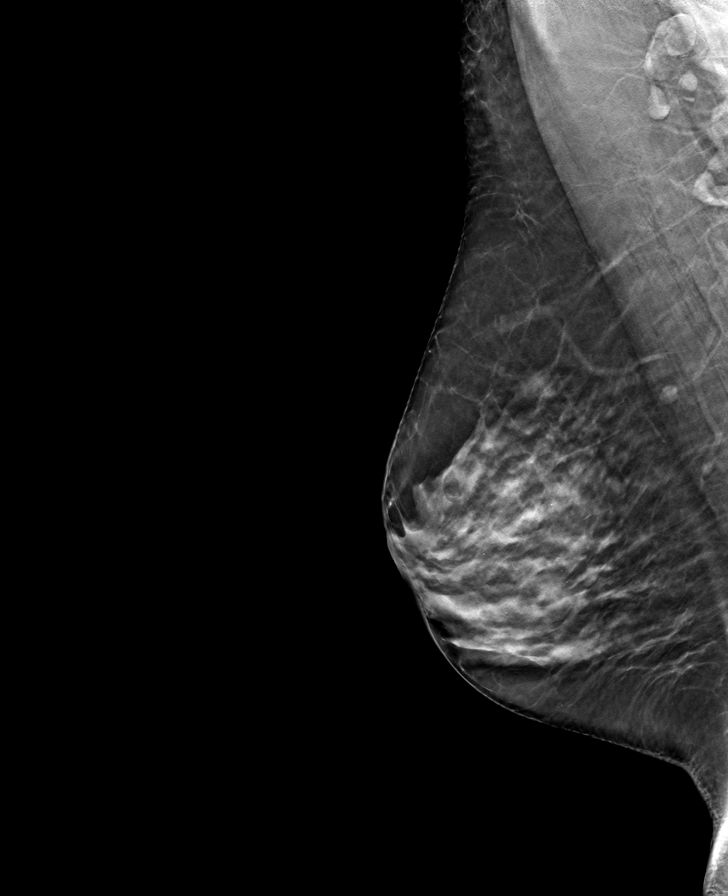

[R CC tomo · tomo slice 27/52.0]
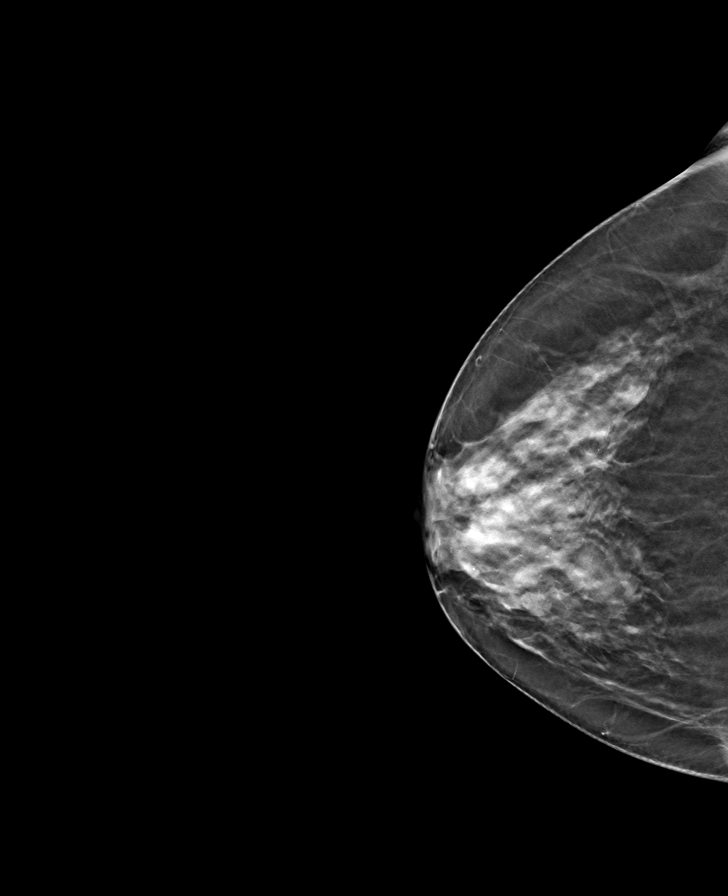

[L MLO tomo · tomo slice 28/55.0]
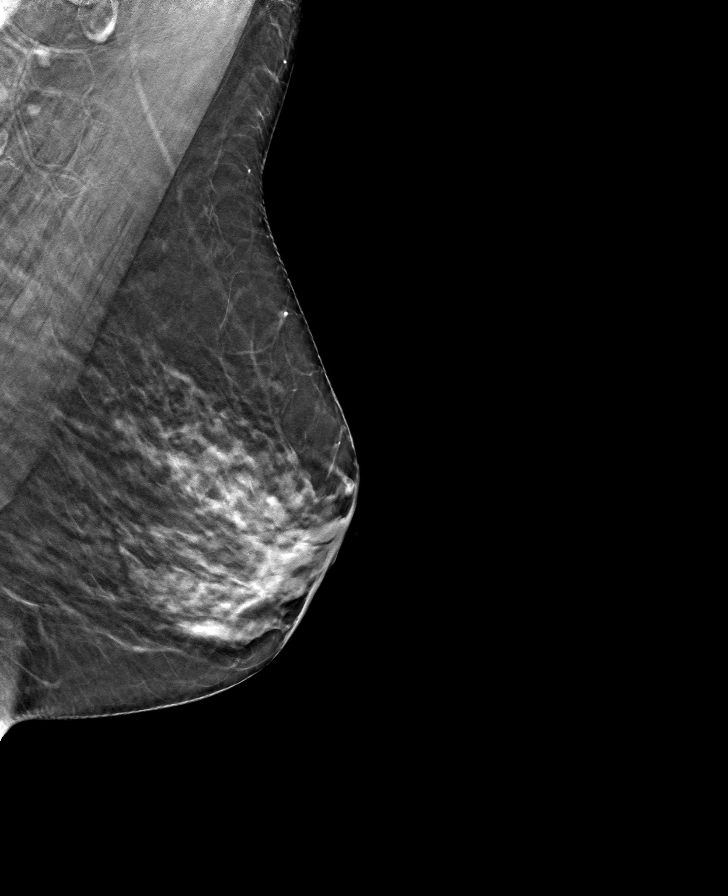

[8 of 24 positions shown; findings below may reference images not displayed]

ACR Breast Density Category c: The breast tissue is heterogeneously
dense, which may obscure small masses.
FINDINGS: There are no findings suspicious for malignancy. Images were
processed with CAD.
IMPRESSION: No mammographic evidence of malignancy. A result letter of this
screening mammogram will be mailed directly to the patient.

RECOMMENDATION:
Screening mammogram in one year. (Code:[id number]-U-LHB)

BI-RADS CATEGORY  1: Negative.

## 2020-04-01 ENCOUNTER — Other Ambulatory Visit: Payer: Self-pay | Admitting: Family Medicine

## 2022-09-08 NOTE — Progress Notes (Unsigned)
Electrophysiology Office Note:    Date:  09/09/2022   ID:  Dana Price, DOB 06-01-1960, MRN [id number]  [facility] Cardiologist:  None  [facility] Electrophysiologist:  Lanier Prude, MD   Referring MD: Jacquelin Hawking, PA-C   Chief Complaint: Atrial fibrillation  History of Present Illness:    Dana Price is a 62 y.o. female who I am seeing today for an evaluation of atrial fibrillation at the request of Dr. Corinda Gubler.  [facility]  The patient has a history of hypertension, hyperlipidemia, anxiety.  She was diagnosed with atrial fibrillation at [facility] March 11, 2022 when she presented to the hospital with tachycardia, palpitations and neck/jaw tightness.  She was started on diltiazem drip.  Ischemic workup was negative at that time.  The patient self converted to sinus rhythm and she was discharged home on diltiazem.  Pill in the pocket flecainide was also prescribed in addition to Eliquis twice daily for stroke prophylaxis.  She saw the outpatient electrophysiology clinic April 21, 2022 at [facility].  She had not experienced interval atrial fibrillation.  She is active.  At the January appointment at [facility], the patient was in a junctional rhythm.  Early ablation was recommended.    She called into [facility] clinic in April 2024 with a long episode of atrial fibrillation.  Alcohol seemed to be a trigger for that episode.  She feels generally fatigued while taking diltiazem.  Has tolerated the Eliquis without bleeding issues.  Has taken the flecainide 1 time since she was discharged from the hospital for an episode of atrial fibrillation.  She is interested in avoiding medications if at all possible moving forward.  She is quite active and exercises regularly.  She goes to pure barre classes.      Their past medical, social and family history was reveiwed.   ROS:   Please see the history of present illness.    All other  systems reviewed and are negative.  EKGs/Labs/Other Studies Reviewed:    The following studies were reviewed today:  March 11, 2022 echo ([facility] report only) EF 55-60 RV normal Mildly dilated left [facility] No significant valvular disease  EKG:  The ekg ordered today demonstrates sinus rhythm.  PR 102 ms.  QRS 86 ms.   Physical Exam:    VS:  BP 136/82   Pulse 69   Ht 5\' 8"  (1.727 m)   Wt 163 lb (73.9 kg)   SpO2 98%   BMI 24.78 kg/m     Wt Readings from Last 3 Encounters:  09/09/22 163 lb (73.9 kg)  06/16/18 162 lb (73.5 kg)  06/02/18 162 lb (73.5 kg)     GEN:  Well nourished, well developed in no acute distress CARDIAC: RRR, no murmurs, rubs, gallops RESPIRATORY:  Clear to auscultation without rales, wheezing or rhonchi       ASSESSMENT AND PLAN:    1. Paroxysmal atrial fibrillation (HCC)   2. Primary hypertension     #Paroxysmal atrial fibrillation Symptomatic.  Previously prescribed pill in the pocket flecainide in addition to her daily diltiazem.  On Eliquis for stroke prophylaxis.  Discussed treatment options today for AF including antiarrhythmic drug therapy and ablation. Discussed risks, recovery and likelihood of success with each treatment strategy. Risk, benefits, and alternatives to EP study and radiofrequency ablation for afib were discussed. These risks include but are not limited to stroke, bleeding, vascular damage, tamponade, perforation, damage to the esophagus, lungs,  phrenic nerve and other structures, pulmonary vein stenosis, worsening renal function, and death.  Discussed potential need for repeat ablation procedures and antiarrhythmic drugs after an initial ablation. The patient understands these risk and wishes to proceed.  We will therefore proceed with catheter ablation at the next available time.  Carto, ICE, anesthesia are requested for the procedure.  Will also obtain CT PV protocol prior to the procedure to exclude LAA thrombus and further  evaluate atrial anatomy.  #Hypertension At goal today.  Recommend checking blood pressures 1-2 times per week at home and recording the values.  Recommend bringing these recordings to the primary care physician.       Signed, Rossie Muskrat. Lalla Brothers, MD, [facility], [facility] 09/09/2022 4:38 PM    Electrophysiology [place] [facility]

## 2022-09-09 ENCOUNTER — Ambulatory Visit: Attending: Cardiology | Admitting: Cardiology

## 2022-09-09 ENCOUNTER — Encounter: Payer: Self-pay | Admitting: Cardiology

## 2022-09-09 VITALS — BP 136/82 | HR 69 | Ht 68.0 in | Wt 163.0 lb

## 2022-09-09 DIAGNOSIS — I10 Essential (primary) hypertension: Secondary | ICD-10-CM | POA: Diagnosis not present

## 2022-09-09 DIAGNOSIS — I480 Paroxysmal atrial fibrillation: Secondary | ICD-10-CM

## 2022-09-09 NOTE — Patient Instructions (Addendum)
Medication Instructions:  Your physician recommends that you continue on your current medications as directed. Please refer to the Current Medication list given to you today.  *If you need a refill on your cardiac medications before your next appointment, please call your pharmacy*  Lab Work: BMET and CBC prior to your ablation at [facility] will get your lab work at [facility]) hospital.  Your lab work will be done at [facility] next to [employer].  These are walk in labs- you will not need an appointment and you do not need to be fasting.    Testing/Procedures: Your physician has requested that you have an echocardiogram. Echocardiography is a painless test that uses sound waves to create images of your heart. It provides your doctor with information about the size and shape of your heart and how well your heart's chambers and valves are working. This procedure takes approximately one hour. There are no restrictions for this procedure. Please do NOT wear cologne, perfume, aftershave, or lotions (deodorant is allowed). Please arrive 15 minutes prior to your appointment time.  Your physician has recommended that you have an ablation. Catheter ablation is a medical procedure used to treat some cardiac arrhythmias (irregular heartbeats). During catheter ablation, a long, thin, flexible tube is put into a blood vessel in your groin (upper thigh), or neck. This tube is called an ablation catheter. It is then guided to your heart through the blood vessel. Radio frequency waves destroy small areas of heart tissue where abnormal heartbeats may cause an arrhythmia to start. Please see the instruction sheet given to you today. You are scheduled for Atrial Fibrillation Ablation on Thursday, October 3 with Dr. Steffanie Dunn.Please arrive at the Main Entrance A at [facility]: [address] [place], [place] [address] at 8:30 AM    Follow-Up: At [facility], you  and your health needs are our priority.  As part of our continuing mission to provide you with exceptional heart care, we have created designated Provider Care Teams.  These Care Teams include your primary Cardiologist (physician) and Advanced Practice Providers (APPs -  Physician Assistants and Nurse Practitioners) who all work together to provide you with the care you need, when you need it.  Your next appointment:   We will call you to schedule your follow up appointments.

## 2022-09-10 ENCOUNTER — Telehealth: Payer: Self-pay | Admitting: Cardiology

## 2022-09-10 NOTE — Telephone Encounter (Signed)
Patient states an ablation has been scheduled for 10/03 with Dr. Lalla Brothers, but she checked her calendar and [place]. She would like to reschedule.

## 2022-09-11 NOTE — Telephone Encounter (Signed)
Pt has been rescheduled for 10/11 @ 7:30.

## 2022-10-05 ENCOUNTER — Ambulatory Visit (HOSPITAL_COMMUNITY): Attending: Cardiology

## 2022-10-28 ENCOUNTER — Ambulatory Visit (HOSPITAL_COMMUNITY): Attending: Cardiology

## 2022-10-28 DIAGNOSIS — I480 Paroxysmal atrial fibrillation: Secondary | ICD-10-CM | POA: Insufficient documentation

## 2022-10-28 DIAGNOSIS — I10 Essential (primary) hypertension: Secondary | ICD-10-CM | POA: Diagnosis not present

## 2022-10-28 LAB — ECHOCARDIOGRAM COMPLETE
Area-P 1/2: 3.31 cm2
S' Lateral: 3 cm

## 2022-11-03 ENCOUNTER — Encounter: Payer: Self-pay | Admitting: Cardiology

## 2022-11-03 NOTE — Telephone Encounter (Signed)
Spoke with the patient and answered questions about her ablation. Patient verbalized understanding. She would like to know how many ablations Dr. Lalla Brothers has performed as well as overall success rate of the procedure. She would also like for Dr. Lalla Brothers to review her echocardiogram results.

## 2022-12-01 NOTE — Progress Notes (Unsigned)
Electrophysiology Office Follow up Visit Note:    Date:  12/02/2022   ID:  Dana Price, DOB 1960-06-17, MRN [id number]  PCP:  Pearson Forster, MD  [facility] Cardiologist:  None  [facility] Electrophysiologist:  Lanier Prude, MD    Interval History:    Dana Price is a 62 y.o. female who presents for a follow up visit.   Last seen September 09, 2018 for by me for symptomatic paroxysmal atrial fibrillation.  She was previously on, pocket flecainide in addition to daily diltiazem.  She takes Eliquis for stroke prophylaxis.  At our last appointment we discussed catheter ablation.  After our appointment, the patient sent a message through epic letting me know she had a list of questions about the ablation procedure.  She presents today to discuss the catheter ablation in detail in more detail.  She is currently scheduled for January 15, 2023.  Today we discussed the catheter ablation procedure and her history of atrial fibrillation.  We reviewed her echo results.  If her biggest concerns is that she could have another episode of severe atrial fibrillation requiring hospitalization.  This is driving a lot of her desire to treat her atrial fibrillation with catheter ablation.  She is thankfully not had another episode of atrial fibrillation since I last saw her.  She continues to take Eliquis but is very interested in avoiding long-term use if at all possible.  She like to minimize medication use in general if she can.      Past medical, surgical, social and family history were reviewed.  ROS:   Please see the history of present illness.    All other systems reviewed and are negative.  EKGs/Labs/Other Studies Reviewed:    The following studies were reviewed today:  October 28, 2022 echo EF 65-70 RV normal Mildly dilated left [facility] Mild MR        Physical Exam:    VS:  BP 110/80 (BP Location: Left Arm, Patient Position: Sitting, Cuff Size: Normal)   Pulse 73    Ht 5\' 8"  (1.727 m)   Wt 161 lb 2 oz (73.1 kg)   SpO2 97%   BMI 24.50 kg/m     Wt Readings from Last 3 Encounters:  12/02/22 161 lb 2 oz (73.1 kg)  09/09/22 163 lb (73.9 kg)  06/16/18 162 lb (73.5 kg)     GEN:  Well nourished, well developed in no acute distress CARDIAC: RRR, no murmurs, rubs, gallops RESPIRATORY:  Clear to auscultation without rales, wheezing or rhonchi       ASSESSMENT:    1. Paroxysmal atrial fibrillation (HCC)   2. Primary hypertension    PLAN:    In order of problems listed above:  #Paroxysmal atrial fibrillation Highly symptomatic when she has episodes of atrial fibrillation.  Thankfully, her episodes of A-fib have remained rare.  She is interested in treating her atrial fibrillation in an effort to avoid the risk of future hospitalizations or severe symptoms.  Long discussion today about treatment options and the catheter ablation procedure in detail.  We also discussed medications and how they work and the risks associated with medication use. Continue diltiazem and Eliquis  Discussed treatment options today for AF including antiarrhythmic drug therapy and ablation. Discussed risks, recovery and likelihood of success with each treatment strategy. Risk, benefits, and alternatives to EP study and ablation for afib were discussed. These risks include but are not limited to stroke, bleeding, vascular damage, tamponade, perforation,  damage to the esophagus, lungs, phrenic nerve and other structures, pulmonary vein stenosis, worsening renal function, coronary vasospasm and death.  Discussed potential need for repeat ablation procedures and antiarrhythmic drugs after an initial ablation. The patient understands these risk and wishes to proceed.  We will therefore proceed with catheter ablation at the next available time.  Carto, ICE, anesthesia are requested for the procedure.  Will also obtain CT PV protocol prior to the procedure to exclude LAA thrombus and  further evaluate atrial anatomy.  #Hypertension At goal today.  Recommend checking blood pressures 1-2 times per week at home and recording the values.  Recommend bringing these recordings to the primary care physician. Continue losartan and diltiazem.       Signed, Steffanie Dunn, MD, [facility], [facility] 12/02/2022 9:37 AM    Electrophysiology [facility]

## 2022-12-02 ENCOUNTER — Encounter: Payer: Self-pay | Admitting: Cardiology

## 2022-12-02 ENCOUNTER — Other Ambulatory Visit: Payer: Self-pay

## 2022-12-02 ENCOUNTER — Ambulatory Visit: Attending: Cardiology | Admitting: Cardiology

## 2022-12-02 VITALS — BP 110/80 | HR 73 | Ht 68.0 in | Wt 161.1 lb

## 2022-12-02 DIAGNOSIS — I480 Paroxysmal atrial fibrillation: Secondary | ICD-10-CM | POA: Diagnosis not present

## 2022-12-02 DIAGNOSIS — I10 Essential (primary) hypertension: Secondary | ICD-10-CM | POA: Diagnosis not present

## 2022-12-02 NOTE — Patient Instructions (Signed)
Medication Instructions:  Your physician recommends that you continue on your current medications as directed. Please refer to the Current Medication list given to you today.  *If you need a refill on your cardiac medications before your next appointment, please call your pharmacy*  Lab Work: BMET and CBC   Follow-Up: At [facility], you and your health needs are our priority.  As part of our continuing mission to provide you with exceptional heart care, we have created designated Provider Care Teams.  These Care Teams include your primary Cardiologist (physician) and Advanced Practice Providers (APPs -  Physician Assistants and Nurse Practitioners) who all work together to provide you with the care you need, when you need it.  Your next appointment:   We will arrange follow up appointments for you after your ablation.

## 2022-12-21 LAB — CBC
Hematocrit: 37 % (ref 34.0–46.6)
Hemoglobin: 12.1 g/dL (ref 11.1–15.9)
MCH: 28.1 pg (ref 26.6–33.0)
MCHC: 32.7 g/dL (ref 31.5–35.7)
MCV: 86 fL (ref 79–97)
Platelets: 432 10*3/uL (ref 150–450)
RBC: 4.31 x10E6/uL (ref 3.77–5.28)
RDW: 12.8 % (ref 11.7–15.4)
WBC: 8.8 10*3/uL (ref 3.4–10.8)

## 2022-12-22 LAB — BASIC METABOLIC PANEL
BUN/Creatinine Ratio: 17 (ref 12–28)
BUN: 15 mg/dL (ref 8–27)
CO2: 21 mmol/L (ref 20–29)
Calcium: 9.1 mg/dL (ref 8.7–10.3)
Chloride: 101 mmol/L (ref 96–106)
Creatinine, Ser: 0.9 mg/dL (ref 0.57–1.00)
Glucose: 95 mg/dL (ref 70–99)
Potassium: 4.3 mmol/L (ref 3.5–5.2)
Sodium: 139 mmol/L (ref 134–144)
eGFR: 72 mL/min/{1.73_m2} (ref >59)

## 2023-01-14 NOTE — Anesthesia Preprocedure Evaluation (Addendum)
Anesthesia Evaluation  Patient identified by MRN, date of birth, ID band Patient awake    Reviewed: Allergy & Precautions, NPO status , Patient's Chart, lab work & pertinent test results  History of Anesthesia Complications Negative for: history of anesthetic complications  Airway Mallampati: I  TM Distance: >3 FB Neck ROM: Full    Dental no notable dental hx.    Pulmonary former smoker   Pulmonary exam normal        Cardiovascular Normal cardiovascular exam+ dysrhythmias (on Eliquis) Atrial Fibrillation      Neuro/Psych  Headaches  Anxiety        GI/Hepatic negative GI ROS, Neg liver ROS,,,  Endo/Other  negative endocrine ROS    Renal/GU negative Renal ROS     Musculoskeletal negative musculoskeletal ROS (+)    Abdominal   Peds  Hematology negative hematology ROS (+)   Anesthesia Other Findings Day of surgery medications reviewed with patient.  Reproductive/Obstetrics                              Anesthesia Physical Anesthesia Plan  ASA: 3  Anesthesia Plan: General   Post-op Pain Management: Tylenol PO (pre-op)*   Induction: Intravenous  PONV Risk Score and Plan: 3 and Treatment may vary due to age or medical condition, Ondansetron, Dexamethasone and Midazolam  Airway Management Planned: Oral ETT  Additional Equipment: None  Intra-op Plan:   Post-operative Plan: Extubation in OR  Informed Consent: I have reviewed the patients History and Physical, chart, labs and discussed the procedure including the risks, benefits and alternatives for the proposed anesthesia with the patient or authorized representative who has indicated his/her understanding and acceptance.     Dental advisory given  Plan Discussed with: CRNA  Anesthesia Plan Comments:         Anesthesia Quick Evaluation

## 2023-01-14 NOTE — Pre-Procedure Instructions (Signed)
Instructed patient on the following items: Arrival time 0515 Nothing to eat or drink after midnight No meds AM of procedure Responsible person to drive you home and stay with you for 24 hrs  Have you missed any doses of anti-coagulant Eliquis- takes twice a day, hasn't missed any doses.  Don't take dose in the morning.

## 2023-01-15 ENCOUNTER — Other Ambulatory Visit (HOSPITAL_COMMUNITY): Payer: Self-pay

## 2023-01-15 ENCOUNTER — Encounter (HOSPITAL_COMMUNITY): Payer: Self-pay | Admitting: *Deleted

## 2023-01-15 ENCOUNTER — Ambulatory Visit (HOSPITAL_COMMUNITY)
Admission: RE | Admit: 2023-01-15 | Discharge: 2023-01-15 | Disposition: A | Discharge status: Home / Self Care | Attending: Cardiology | Admitting: Cardiology

## 2023-01-15 ENCOUNTER — Emergency Department (HOSPITAL_COMMUNITY)
Admission: EM | Admit: 2023-01-15 | Discharge: 2023-01-15 | Disposition: A | Discharge status: Home / Self Care | Source: Home / Self Care | Attending: Emergency Medicine | Admitting: Emergency Medicine

## 2023-01-15 ENCOUNTER — Ambulatory Visit (HOSPITAL_COMMUNITY): Admitting: Anesthesiology

## 2023-01-15 ENCOUNTER — Ambulatory Visit (HOSPITAL_BASED_OUTPATIENT_CLINIC_OR_DEPARTMENT_OTHER): Admitting: Anesthesiology

## 2023-01-15 ENCOUNTER — Other Ambulatory Visit: Payer: Self-pay

## 2023-01-15 ENCOUNTER — Encounter (HOSPITAL_COMMUNITY)
Admission: RE | Disposition: A | Discharge status: Home / Self Care | Payer: Self-pay | Source: Home / Self Care | Attending: Cardiology

## 2023-01-15 DIAGNOSIS — I4819 Other persistent atrial fibrillation: Secondary | ICD-10-CM | POA: Diagnosis present

## 2023-01-15 DIAGNOSIS — I4891 Unspecified atrial fibrillation: Secondary | ICD-10-CM | POA: Diagnosis not present

## 2023-01-15 DIAGNOSIS — Z87891 Personal history of nicotine dependence: Secondary | ICD-10-CM | POA: Insufficient documentation

## 2023-01-15 DIAGNOSIS — R58 Hemorrhage, not elsewhere classified: Secondary | ICD-10-CM

## 2023-01-15 DIAGNOSIS — Y838 Other surgical procedures as the cause of abnormal reaction of the patient, or of later complication, without mention of misadventure at the time of the procedure: Secondary | ICD-10-CM | POA: Diagnosis not present

## 2023-01-15 DIAGNOSIS — S75011A Minor laceration of femoral artery, right leg, initial encounter: Secondary | ICD-10-CM | POA: Insufficient documentation

## 2023-01-15 DIAGNOSIS — X58XXXA Exposure to other specified factors, initial encounter: Secondary | ICD-10-CM | POA: Insufficient documentation

## 2023-01-15 DIAGNOSIS — Z7901 Long term (current) use of anticoagulants: Secondary | ICD-10-CM | POA: Insufficient documentation

## 2023-01-15 DIAGNOSIS — I10 Essential (primary) hypertension: Secondary | ICD-10-CM | POA: Insufficient documentation

## 2023-01-15 HISTORY — PX: ATRIAL FIBRILLATION ABLATION: EP1191

## 2023-01-15 LAB — POCT ACTIVATED CLOTTING TIME: Activated Clotting Time: 299 s

## 2023-01-15 SURGERY — ATRIAL FIBRILLATION ABLATION
Anesthesia: General

## 2023-01-15 MED ORDER — LIDOCAINE 2% (20 MG/ML) 5 ML SYRINGE
INTRAMUSCULAR | Status: DC | PRN
  Administered 2023-01-15: 40 mg via INTRAVENOUS
  Administered 2023-01-15: 60 mg via INTRAVENOUS

## 2023-01-15 MED ORDER — SODIUM CHLORIDE 0.9% FLUSH: 3.0000 mL | INTRAVENOUS | Status: DC | PRN

## 2023-01-15 MED ORDER — MIDAZOLAM HCL 2 MG/2ML IJ SOLN
INTRAMUSCULAR | Status: DC | PRN
  Administered 2023-01-15: 2 mg via INTRAVENOUS

## 2023-01-15 MED ORDER — ROCURONIUM BROMIDE 10 MG/ML (PF) SYRINGE
PREFILLED_SYRINGE | INTRAVENOUS | Status: DC | PRN
  Administered 2023-01-15 (×2): 20 mg via INTRAVENOUS
  Administered 2023-01-15: 50 mg via INTRAVENOUS

## 2023-01-15 MED ORDER — PROTAMINE SULFATE 10 MG/ML IV SOLN
INTRAVENOUS | Status: DC | PRN
Start: 2023-01-15 — End: 2023-01-15
  Administered 2023-01-15: 30 mg via INTRAVENOUS

## 2023-01-15 MED ORDER — ACETAMINOPHEN 325 MG PO TABS: 650.0000 mg | ORAL_TABLET | ORAL | Status: DC | PRN

## 2023-01-15 MED ORDER — SODIUM CHLORIDE 0.9% FLUSH: 3.0000 mL | Freq: Two times a day (BID) | INTRAVENOUS | Status: DC

## 2023-01-15 MED ORDER — PANTOPRAZOLE SODIUM 40 MG PO TBEC
40.0000 mg | DELAYED_RELEASE_TABLET | Freq: Every day | ORAL | Status: DC
  Administered 2023-01-15: 40 mg via ORAL
  Filled 2023-01-15 (×2): qty 1

## 2023-01-15 MED ORDER — COLCHICINE 0.6 MG PO TABS
0.6000 mg | ORAL_TABLET | Freq: Two times a day (BID) | ORAL | Status: DC
  Administered 2023-01-15: 0.6 mg via ORAL
  Filled 2023-01-15 (×2): qty 1

## 2023-01-15 MED ORDER — LIDOCAINE-EPINEPHRINE (PF) 2 %-1:200000 IJ SOLN
10.0000 mL | Freq: Once | INTRAMUSCULAR | Status: DC
  Filled 2023-01-15: qty 20

## 2023-01-15 MED ORDER — SUGAMMADEX SODIUM 200 MG/2ML IV SOLN
INTRAVENOUS | Status: DC | PRN
  Administered 2023-01-15: 200 mg via INTRAVENOUS

## 2023-01-15 MED ORDER — ONDANSETRON HCL 4 MG/2ML IJ SOLN: 4.0000 mg | Freq: Four times a day (QID) | INTRAMUSCULAR | Status: DC | PRN

## 2023-01-15 MED ORDER — PROPOFOL 500 MG/50ML IV EMUL
INTRAVENOUS | Status: DC | PRN
Start: 2023-01-15 — End: 2023-01-15
  Administered 2023-01-15: 55 ug/kg/min via INTRAVENOUS

## 2023-01-15 MED ORDER — ATROPINE SULFATE 1 MG/ML IV SOLN
INTRAVENOUS | Status: DC | PRN
Start: 2023-01-15 — End: 2023-01-15
  Administered 2023-01-15: 1 mg via INTRAVENOUS

## 2023-01-15 MED ORDER — HEPARIN SODIUM (PORCINE) 1000 UNIT/ML IJ SOLN
INTRAMUSCULAR | Status: DC | PRN
Start: 2023-01-15 — End: 2023-01-15
  Administered 2023-01-15: 4000 [IU] via INTRAVENOUS
  Administered 2023-01-15: 11000 [IU] via INTRAVENOUS

## 2023-01-15 MED ORDER — COLCHICINE 0.6 MG PO TABS
0.6000 mg | ORAL_TABLET | Freq: Two times a day (BID) | ORAL | 0 refills | Status: DC
Start: 2023-01-15 — End: 2023-02-12
  Filled 2023-01-15: qty 10, 5d supply, fill #0

## 2023-01-15 MED ORDER — DEXAMETHASONE SODIUM PHOSPHATE 10 MG/ML IJ SOLN
INTRAMUSCULAR | Status: DC | PRN
  Administered 2023-01-15: 10 mg via INTRAVENOUS

## 2023-01-15 MED ORDER — PANTOPRAZOLE SODIUM 40 MG PO TBEC
40.0000 mg | DELAYED_RELEASE_TABLET | Freq: Every day | ORAL | 0 refills | Status: DC
  Filled 2023-01-15: qty 45, 45d supply, fill #0

## 2023-01-15 MED ORDER — ONDANSETRON HCL 4 MG/2ML IJ SOLN
INTRAMUSCULAR | Status: DC | PRN
  Administered 2023-01-15: 4 mg via INTRAVENOUS

## 2023-01-15 MED ORDER — SODIUM CHLORIDE 0.9 % IV SOLN: INTRAVENOUS | Status: DC

## 2023-01-15 MED ORDER — APIXABAN 5 MG PO TABS
5.0000 mg | ORAL_TABLET | Freq: Two times a day (BID) | ORAL | Status: DC
  Administered 2023-01-15: 5 mg via ORAL
  Filled 2023-01-15: qty 1

## 2023-01-15 MED ORDER — HEPARIN (PORCINE) IN NACL 1000-0.9 UT/500ML-% IV SOLN
INTRAVENOUS | Status: DC | PRN
  Administered 2023-01-15 (×3): 500 mL

## 2023-01-15 MED ORDER — ACETAMINOPHEN 500 MG PO TABS
1000.0000 mg | ORAL_TABLET | Freq: Once | ORAL | Status: AC
  Administered 2023-01-15: 1000 mg via ORAL
  Filled 2023-01-15: qty 2

## 2023-01-15 MED ORDER — HEPARIN SODIUM (PORCINE) 1000 UNIT/ML IJ SOLN
INTRAMUSCULAR | Status: AC
  Filled 2023-01-15: qty 10

## 2023-01-15 MED ORDER — PROPOFOL 10 MG/ML IV BOLUS
INTRAVENOUS | Status: DC | PRN
Start: 2023-01-15 — End: 2023-01-15
  Administered 2023-01-15: 140 mg via INTRAVENOUS

## 2023-01-15 MED ORDER — FENTANYL CITRATE (PF) 250 MCG/5ML IJ SOLN
INTRAMUSCULAR | Status: DC | PRN
  Administered 2023-01-15 (×3): 50 ug via INTRAVENOUS

## 2023-01-15 SURGICAL SUPPLY — 22 items
BLANKET WARM UNDERBOD FULL ACC (MISCELLANEOUS) ×1 IMPLANT
CABLE PFA RX CATH CONN (CABLE) IMPLANT
CATH 8FR REPROCESSED SOUNDSTAR (CATHETERS) ×1 IMPLANT
CATH 8FR SOUNDSTAR REPROCESSED (CATHETERS) IMPLANT
CATH FARAWAVE ABLATION 31 (CATHETERS) IMPLANT
CATH MAPPNG PENTARAY F 2-6-2MM (CATHETERS) IMPLANT
CATH WEB BI DIR CSDF CRV REPRO (CATHETERS) IMPLANT
CLOSURE PERCLOSE PROSTYLE (VASCULAR PRODUCTS) IMPLANT
COVER SWIFTLINK CONNECTOR (BAG) ×1 IMPLANT
DILATOR VESSEL 38 20CM 16FR (INTRODUCER) IMPLANT
GUIDEWIRE INQWIRE 1.5J.035X260 (WIRE) IMPLANT
INQWIRE 1.5J .035X260CM (WIRE) ×1
PACK EP LATEX FREE (CUSTOM PROCEDURE TRAY) ×1
PACK EP LF (CUSTOM PROCEDURE TRAY) ×1 IMPLANT
PAD DEFIB RADIO PHYSIO CONN (PAD) ×1 IMPLANT
PATCH CARTO3 (PAD) IMPLANT
PENTARAY F 2-6-2MM (CATHETERS) ×1
SHEATH FARADRIVE STEERABLE (SHEATH) IMPLANT
SHEATH PINNACLE 8F 10CM (SHEATH) IMPLANT
SHEATH PINNACLE 9F 10CM (SHEATH) IMPLANT
SHEATH PROBE COVER 6X72 (BAG) IMPLANT
SHEATH WIRE KIT BAYLIS SL1 (KITS) IMPLANT

## 2023-01-15 NOTE — Anesthesia Postprocedure Evaluation (Signed)
Anesthesia Post Note  Patient: Dana Price  Procedure(s) Performed: ATRIAL FIBRILLATION ABLATION     Patient location during evaluation: PACU Anesthesia Type: General Level of consciousness: awake and alert Pain management: pain level controlled Vital Signs Assessment: post-procedure vital signs reviewed and stable Respiratory status: spontaneous breathing, nonlabored ventilation and respiratory function stable Cardiovascular status: blood pressure returned to baseline Postop Assessment: no apparent nausea or vomiting Anesthetic complications: no   No notable events documented.  Last Vitals:  Vitals:   01/15/23 1010 01/15/23 1015  BP: 123/82 122/89  Pulse: 66 71  Resp: (!) 23 (!) 21  Temp:  36.8 C  SpO2: 93% 95%    Last Pain:  Vitals:   01/15/23 1015  TempSrc: Temporal  PainSc: 0-No pain                 Shanda Howells

## 2023-01-15 NOTE — ED Notes (Signed)
Ice pack  applied to groin area along with 4x4 gauze. Pt advising bleeding slowing down att

## 2023-01-15 NOTE — Transfer of Care (Signed)
Immediate Anesthesia Transfer of Care Note  Patient: Dana Price  Procedure(s) Performed: ATRIAL FIBRILLATION ABLATION  Patient Location: PACU and Cath Lab  Anesthesia Type:General  Level of Consciousness: awake  Airway & Oxygen Therapy: Patient Spontanous Breathing  Post-op Assessment: Report given to RN  Post vital signs: stable  Last Vitals:  Vitals Value Taken Time  BP    Temp    Pulse    Resp    SpO2      Last Pain:  Vitals:   01/15/23 0625  TempSrc:   PainSc: 0-No pain         Complications: No notable events documented.

## 2023-01-15 NOTE — Discharge Instructions (Signed)

## 2023-01-15 NOTE — ED Provider Notes (Signed)
Dana Price Provider Note   CSN: [id number] Arrival date & time: 01/15/23  1659     History  No chief complaint on file.   Dana Price is a 62 y.o. female.  HPI   62 year old female with past medical history of atrial fibrillation presents to the emergency department post ablation with concern for bleeding from groin access site.  Cardiologist Dr. Lalla Brothers is bedside.  Patient is anticoagulated and compliant with her Eliquis.  There is a slow trickle coming from the right groin incision site.  Vitals are otherwise stable.  No noted trauma.  Right leg is otherwise neurovascularly intact.  Home Medications Prior to Admission medications   Medication Sig Start Date End Date Taking? Authorizing Provider  B Complex Vitamins (VITAMIN B COMPLEX) TABS Take 1 tablet by mouth daily.    [provider]  Brimonidine Tartrate (LUMIFY) 0.025 % SOLN Place 1 drop into both eyes daily.    [provider]  Cholecalciferol (VITAMIN D) 125 MCG (5000 UT) CAPS Take 5,000 Units by mouth daily.    [provider]  colchicine 0.6 MG tablet Take 1 tablet (0.6 mg total) by mouth 2 (two) times daily for 5 days. 01/15/23 01/20/23  Dana Freer, PA-C  diltiazem (CARDIZEM CD) 120 MG 24 hr capsule Take 120 mg by mouth daily. 08/11/22   [provider]  ELIQUIS 5 MG TABS tablet Take 5 mg by mouth 2 (two) times daily. 04/08/22   [provider]  EPINEPHrine 0.3 mg/0.3 mL IJ SOAJ injection Inject 0.3 mg into the muscle as needed for anaphylaxis. 09/19/21   [provider]  estradiol (ESTRACE) 0.1 MG/GM vaginal cream Place 1 Applicatorful vaginally as needed (Dryness). 08/04/22   [provider]  flecainide (TAMBOCOR) 50 MG tablet Take 50 mg by mouth daily as needed (afib). 03/13/22   [provider]  losartan (COZAAR) 50 MG tablet Take 50 mg by mouth daily. 08/05/21   [provider]   MAGNESIUM PO Take 200 mg by mouth at bedtime. 03/11/22   [provider]  Omega-3 Fatty Acids (FISH OIL PO) Take 1 capsule by mouth daily. Fatty 15    [provider]  pantoprazole (PROTONIX) 40 MG tablet Take 1 tablet (40 mg total) by mouth daily. 01/15/23 03/01/23  Dana Freer, PA-C  valACYclovir (VALTREX) 500 MG tablet Take 500 mg by mouth as needed (Cold sores). 01/14/15   [provider]      Allergies    Bee venom, Iodinated contrast media, Iodine, Sulfa antibiotics, and Sulfonamide derivatives    Review of Systems   Review of Systems  Constitutional:  Negative for fever.  Respiratory:  Negative for shortness of breath.   Cardiovascular:  Negative for chest pain.  Musculoskeletal:        Bleeding from right groin access site    Physical Exam Updated Vital Signs BP (!) 150/90 (BP Location: Left Arm)   Pulse 76   Temp 98.5 F (36.9 C)   Resp 18   SpO2 98%  Physical Exam Vitals and nursing note reviewed.  Constitutional:      General: She is not in acute distress.    Appearance: Normal appearance.  HENT:     Head: Normocephalic.     Mouth/Throat:     Mouth: Mucous membranes are moist.  Cardiovascular:     Rate and Rhythm: Normal rate.  Pulmonary:     Effort: Pulmonary effort is normal.  No respiratory distress.  Musculoskeletal:     Comments: Small incision in the right groin with trickling of blood, no pulsatile bleeding  Skin:    General: Skin is warm.  Neurological:     Mental Status: She is alert and oriented to person, place, and time. Mental status is at baseline.  Psychiatric:        Mood and Affect: Mood normal.     ED Results / Procedures / Treatments   Labs (all labs ordered are listed, but only abnormal results are displayed) Labs Reviewed - No data to display  EKG None  Radiology EP STUDY  Result Date: 01/15/2023 CONCLUSIONS: 1. Successful PVI 2. Successful ablation/isolation of the posterior wall 3.  Intracardiac echo reveals trivial pericardial effusion, normal LA architecture 4. No early apparent complications. 5. Colchicine 0.6mg  PO BID x 5 days 6. Protonix 40mg  PO daily x 45 days    Procedures .Marland KitchenLaceration Repair  Date/Time: 01/15/2023 6:48 PM  Performed by: Rozelle Logan, DO Authorized by: Rozelle Logan, DO   Consent:    Consent obtained:  Verbal Anesthesia:    Anesthesia method:  Local infiltration   Local anesthetic:  Lidocaine 2% WITH epi Laceration details:    Location:  Leg   Leg location:  R upper leg   Length (cm):  1 Exploration:    Hemostasis achieved with:  Direct pressure Approximation:    Approximation:  Loose Repair type:    Repair type:  Simple Post-procedure details:    Dressing:  Adhesive bandage   Procedure completion:  Tolerated     Medications Ordered in ED Medications  lidocaine-EPINEPHrine (XYLOCAINE W/EPI) 2 %-1:200000 (PF) injection 10 mL (has no administration in time range)    ED Course/ Medical Decision Making/ A&P                                 Medical Decision Making Risk Prescription drug management.   62 year old female presents emergency department with ongoing bleeding from right groin access site from cardiac ablation done earlier this morning with Dr. Lalla Brothers.  Dr. Lalla Brothers is bedside.  Requested lidocaine with epinephrine, to be injected into the site.  Injected into access site with hemostasis achieved.  Pressure bandage placed.  He recommended to observe the patient for 30 minutes and then walker and reevaluate for any breakthrough bleeding.  Patient was reevaluated over 30 minutes from procedure.  She was ambulated without any difficulty, there is no swelling or signs of bleeding.  Will plan for outpatient follow-up per Dr. Lovena Neighbours recommendations.  Patient at this time appears safe and stable for discharge and close outpatient follow up. Discharge plan and strict return to ED precautions discussed, patient  verbalizes understanding and agreement.        Final Clinical Impression(s) / ED Diagnoses Final diagnoses:  None    Rx / DC Orders ED Discharge Orders     None         Rozelle Logan, DO 01/15/23 1849

## 2023-01-15 NOTE — H&P (Signed)
Electrophysiology Office Follow up Visit Note:     Date:  01/15/2023    ID:  Dana Price, DOB Aug 26, 1960, MRN [id number]   PCP:  Pearson Forster, MD      [facility] Cardiologist:  None  [facility] Electrophysiologist:  Lanier Prude, MD      Interval History:     Dana Price is a 62 y.o. female who presents for a follow up visit.    Last seen September 09, 2018 for by me for symptomatic paroxysmal atrial fibrillation.  She was previously on, pocket flecainide in addition to daily diltiazem.  She takes Eliquis for stroke prophylaxis.  At our last appointment we discussed catheter ablation.   After our appointment, the patient sent a message through epic letting me know she had a list of questions about the ablation procedure.  She presents today to discuss the catheter ablation in detail in more detail.  She is currently scheduled for January 15, 2023.   Today we discussed the catheter ablation procedure and her history of atrial fibrillation.  We reviewed her echo results.  If her biggest concerns is that she could have another episode of severe atrial fibrillation requiring hospitalization.  This is driving a lot of her desire to treat her atrial fibrillation with catheter ablation.  She is thankfully not had another episode of atrial fibrillation since I last saw her.  She continues to take Eliquis but is very interested in avoiding long-term use if at all possible.  She like to minimize medication use in general if she can.   Presents for PVI today.     Objective Past medical, surgical, social and family history were reviewed.   ROS:   Please see the history of present illness.    All other systems reviewed and are negative.   EKGs/Labs/Other Studies Reviewed:     The following studies were reviewed today:   October 28, 2022 echo EF 65-70 RV normal Mildly dilated left atrium Mild MR           Physical Exam:     VS:  BP 154/96 (BP Location: Left Arm,  Patient Position: Sitting, Cuff Size: Normal)   Pulse 67   Ht 5\' 8"  (1.727 m)   Wt 161 lb 2 oz (73.1 kg)   SpO2 97%   BMI 24.50 kg/m         Wt Readings from Last 3 Encounters:  12/02/22 161 lb 2 oz (73.1 kg)  09/09/22 163 lb (73.9 kg)  06/16/18 162 lb (73.5 kg)      GEN:  Well nourished, well developed in no acute distress CARDIAC: RRR, no murmurs, rubs, gallops RESPIRATORY:  Clear to auscultation without rales, wheezing or rhonchi          Assessment ASSESSMENT:     1. Paroxysmal atrial fibrillation (HCC)   2. Primary hypertension     PLAN:     In order of problems listed above:   #Paroxysmal atrial fibrillation Highly symptomatic when she has episodes of atrial fibrillation.  Thankfully, her episodes of A-fib have remained rare.  She is interested in treating her atrial fibrillation in an effort to avoid the risk of future hospitalizations or severe symptoms.  Long discussion today about treatment options and the catheter ablation procedure in detail.  We also discussed medications and how they work and the risks associated with medication use. Continue diltiazem and Eliquis   Discussed treatment options today for AF including antiarrhythmic drug  therapy and ablation. Discussed risks, recovery and likelihood of success with each treatment strategy. Risk, benefits, and alternatives to EP study and ablation for afib were discussed. These risks include but are not limited to stroke, bleeding, vascular damage, tamponade, perforation, damage to the esophagus, lungs, phrenic nerve and other structures, pulmonary vein stenosis, worsening renal function, coronary vasospasm and death.  Discussed potential need for repeat ablation procedures and antiarrhythmic drugs after an initial ablation. The patient understands these risk and wishes to proceed.  We will therefore proceed with catheter ablation at the next available time.  Carto, ICE, anesthesia are requested for the procedure.  Will  also obtain CT PV protocol prior to the procedure to exclude LAA thrombus and further evaluate atrial anatomy.   #Hypertension At goal today.  Recommend checking blood pressures 1-2 times per week at home and recording the values.  Recommend bringing these recordings to the primary care physician. Continue losartan and diltiazem.      Presents for PVI today. Procedure reviewed.       Signed, Steffanie Dunn, MD, [facility], [facility] 01/15/2023 Electrophysiology  [facility]

## 2023-01-15 NOTE — Discharge Instructions (Addendum)
Keep the area clean and dressed with 4 x 4 and pressure.  Follow-up per Dr. Lovena Neighbours recommendations.

## 2023-01-15 NOTE — Anesthesia Procedure Notes (Addendum)
Procedure Name: Intubation Date/Time: 01/15/2023 8:05 AM  Performed by: Kaylyn Layer, MDPre-anesthesia Checklist: Patient identified, Emergency Drugs available, Suction available, Patient being monitored and Timeout performed Patient Re-evaluated:Patient Re-evaluated prior to induction Oxygen Delivery Method: Circle system utilized Preoxygenation: Pre-oxygenation with 100% oxygen Induction Type: IV induction Ventilation: Mask ventilation without difficulty Laryngoscope Size: Miller and 2 Grade View: Grade II Tube type: Oral Tube size: 7.0 mm Number of attempts: one look CRNA no attempt to pass /placed by MD. Airway Equipment and Method: Stylet Placement Confirmation: ETT inserted through vocal cords under direct vision, positive ETCO2, CO2 detector and breath sounds checked- equal and bilateral Secured at: 22 cm Tube secured with: Tape Dental Injury: Teeth and Oropharynx as per pre-operative assessment  Comments: CRNA with no view of cords. Second attempt by myself with 2A view with Hyacinth Meeker 2. Atraumatic intubation. Stephannie Peters, MD

## 2023-01-15 NOTE — ED Triage Notes (Signed)
The pt had an ablation today in oooout-patient  she arrived home approx 1400 the bleeding in her rt groin would not stop bleeding she came back and a doctor met her in the triage room  treatemnt given  the bleeding has stopped fpr now

## 2023-01-15 NOTE — Addendum Note (Signed)
Addendum  created 01/15/23 1047 by Kaylyn Layer, MD   Clinical Note Signed, Intraprocedure Blocks edited, SmartForm saved

## 2023-01-15 NOTE — Progress Notes (Signed)
Natalia Leatherwood, the patient's daughter called and reported the patient's site was bleeding from this mornings procedure, soaking through the guaze approximately every 30 to 40 minutes. She reported the site was not hard. I advised to have the patient lay flat and hold pressure which the patient's daughter was already doing. I spoke to Dr. Lalla Brothers and he advised to advise the patient's family member to hold pressure on the site for 20 minutes and if the patients site is still bleeding to proceed to the nearest emergency room. I advised her what doctor Lalla Brothers said, and told her to call 911 if she was not comfortable holding pressure, or if the bleeding continued or got worse.

## 2023-01-17 ENCOUNTER — Encounter: Payer: Self-pay | Admitting: Cardiology

## 2023-01-17 ENCOUNTER — Telehealth: Payer: Self-pay | Admitting: Physician Assistant

## 2023-01-17 NOTE — Telephone Encounter (Signed)
Pt called to report she is very concerned she had an episode of Afib last evening at 130-140 HR that did not respond to relaxation techniques. She took 50 mg flecainide x 3 tablets last night with eventual conversion to sinus rhythm. She states she is very upset because the ablation was supposed to fix her Afib. I relayed that it is not uncommon to still have a recurrence and provided reassurance that she did the right thing taking 50 mg flecainide. She wants to know what to do moving forward and then reported taking 150 mg flecainide last night (as per instructions on her bottle). I instructed her to call our on-call line if she has a recurrence today since she has already taken 150 mg flecainide overnight.   She would like a discussion with Dr. Lalla Brothers early next week. I will forward to him.   Scheduling, any chance she can get into to see someone next week?  Marcelino Duster, PA-C 01/17/2023, 9:41 AM [phone] [facility] [address] [place], [place] [address]

## 2023-01-18 ENCOUNTER — Encounter (HOSPITAL_COMMUNITY): Payer: Self-pay | Admitting: Cardiology

## 2023-01-19 ENCOUNTER — Ambulatory Visit (HOSPITAL_COMMUNITY)
Admission: RE | Admit: 2023-01-19 | Discharge: 2023-01-19 | Disposition: A | Discharge status: Home / Self Care | Source: Ambulatory Visit | Attending: Internal Medicine | Admitting: Internal Medicine

## 2023-01-19 VITALS — BP 150/100 | HR 69 | Ht 68.0 in | Wt 158.2 lb

## 2023-01-19 DIAGNOSIS — Z5181 Encounter for therapeutic drug level monitoring: Secondary | ICD-10-CM

## 2023-01-19 DIAGNOSIS — I480 Paroxysmal atrial fibrillation: Secondary | ICD-10-CM | POA: Diagnosis not present

## 2023-01-19 DIAGNOSIS — I10 Essential (primary) hypertension: Secondary | ICD-10-CM | POA: Diagnosis not present

## 2023-01-19 DIAGNOSIS — Z79899 Other long term (current) drug therapy: Secondary | ICD-10-CM | POA: Diagnosis not present

## 2023-01-19 DIAGNOSIS — Z7901 Long term (current) use of anticoagulants: Secondary | ICD-10-CM | POA: Diagnosis not present

## 2023-01-19 NOTE — Progress Notes (Addendum)
Primary Care Physician: Jacquelin Hawking, PA-C Primary Cardiologist: None Electrophysiologist: Lanier Prude, MD     Referring Physician: Dr. Alfred Levins Almario is a 62 y.o. female with a history of HTN and paroxysmal atrial fibrillation who presents for consultation in the [facility]. S/p Afib ablation on 01/15/23 by Dr. Lalla Brothers. Patient went to ED on 10/11 for bleeding from groin site and assessed by Dr. Lalla Brothers; lidocaine with epinephrine injected to site. Patient called clinic on 10/13 noting she had to take 3 flecainide tablets for episode of Afib. Patient is on Eliquis 5 mg BID for a CHADS2VASC score of 2.  On evaluation today, she is currently in NSR. No episodes of Afib since taking flecainide 150 mg. No chest pain, SOB, or trouble swallowing. Leg sites healed without issue. No missed doses of anticoagulant. She expresses frustration at having to call the on-call line 4 times in order to reach someone for help. She had already taken the flecainide by the time someone had called because it took 3 hours to speak with a provider.  Today, she denies symptoms of orthopnea, PND, lower extremity edema, dizziness, presyncope, syncope, snoring, daytime somnolence, bleeding, or neurologic sequela. The patient is tolerating medications without difficulties and is otherwise without complaint today.   she has a BMI of Body mass index is 24.05 kg/m.Marland Kitchen Filed Weights   01/19/23 0841  Weight: 71.8 kg    Current Outpatient Medications  Medication Sig Dispense Refill   B Complex Vitamins (VITAMIN B COMPLEX) TABS Take 1 tablet by mouth daily.     Brimonidine Tartrate (LUMIFY) 0.025 % SOLN Place 1 drop into both eyes daily.     Cholecalciferol (VITAMIN D) 125 MCG (5000 UT) CAPS Take 5,000 Units by mouth daily.     colchicine 0.6 MG tablet Take 1 tablet (0.6 mg total) by mouth 2 (two) times daily for 5 days. 10 tablet 0   diltiazem (CARDIZEM CD) 120 MG  24 hr capsule Take 120 mg by mouth daily.     ELIQUIS 5 MG TABS tablet Take 5 mg by mouth 2 (two) times daily.     EPINEPHrine 0.3 mg/0.3 mL IJ SOAJ injection Inject 0.3 mg into the muscle as needed for anaphylaxis.     estradiol (ESTRACE) 0.1 MG/GM vaginal cream Place 1 Applicatorful vaginally as needed (Dryness).     flecainide (TAMBOCOR) 50 MG tablet Take 50 mg by mouth daily as needed (afib).     losartan (COZAAR) 50 MG tablet Take 50 mg by mouth daily.     MAGNESIUM PO Take 200 mg by mouth at bedtime.     Omega-3 Fatty Acids (FISH OIL PO) Take 1 capsule by mouth daily. Fatty 15     pantoprazole (PROTONIX) 40 MG tablet Take 1 tablet (40 mg total) by mouth daily. 45 tablet 0   valACYclovir (VALTREX) 500 MG tablet Take 500 mg by mouth as needed (Cold sores).     No current facility-administered medications for this encounter.    Atrial Fibrillation Management history:  Previous antiarrhythmic drugs: flecainide Previous cardioversions:  Previous ablations: 01/15/23 Anticoagulation history: Eliquis   ROS- All systems are reviewed and negative except as per the HPI above.  Physical Exam: BP (!) 150/100   Pulse 69   Ht 5\' 8"  (1.727 m)   Wt 71.8 kg   BMI 24.05 kg/m   GEN: Well nourished, well developed in no acute distress NECK: No JVD; No  carotid bruits CARDIAC: Regular rate and rhythm, no murmurs, rubs, gallops RESPIRATORY:  Clear to auscultation without rales, wheezing or rhonchi  ABDOMEN: Soft, non-tender, non-distended EXTREMITIES:  No edema; No deformity   EKG today demonstrates  Vent. rate 69 BPM PR interval 148 ms QRS duration 88 ms QT/QTcB 408/437 ms P-R-T axes 267 18 60 Unusual P axis, possible ectopic atrial rhythm - appears to be sinus Abnormal ECG When compared with ECG of 15-Jan-2023 09:58, PREVIOUS ECG IS PRESENT  Echo 10/28/22 demonstrated  1. Left ventricular ejection fraction, by estimation, is 65 to 70%. Left  ventricular ejection fraction by 3D  volume is 68 %. The left ventricle has  normal function. The left ventricle has no regional wall motion  abnormalities. Left ventricular diastolic   parameters are indeterminate.   2. Right ventricular systolic function is normal. The right ventricular  size is normal. There is normal pulmonary artery systolic pressure. The  estimated right ventricular systolic pressure is 24.9 mmHg.   3. Left atrial size was mildly dilated.   4. The mitral valve is grossly normal. Mild mitral valve regurgitation.  No evidence of mitral stenosis.   5. The aortic valve is tricuspid. Aortic valve regurgitation is not  visualized. No aortic stenosis is present.   6. Aortic dilatation noted. There is mild dilatation of the aortic root,  measuring 40 mm.   7. The inferior vena cava is normal in size with greater than 50%  respiratory variability, suggesting right atrial pressure of 3 mmHg.   ASSESSMENT & PLAN CHA2DS2-VASc Score = 2  The patient's score is based upon: CHF History: 0 HTN History: 1 Diabetes History: 0 Stroke History: 0 Vascular Disease History: 0 Age Score: 0 Gender Score: 1       ASSESSMENT AND PLAN: Paroxysmal Atrial Fibrillation (ICD10:  I48.0) The patient's CHA2DS2-VASc score is 2, indicating a 2.2% annual risk of stroke.   S/p Afib ablation on 01/15/23 by Dr. Lalla Brothers.   She is currently in NSR. Patient is frustrated at having had Afib since ablation. Reassurance provided that episodes of Afib can occur in the blanking period. Discussion on flecainide pill in the pocket strategy for future if needed. Her intervals today are stable. Apologized to patient that she had to wait for 3 hours before speaking with someone.    Follow up as scheduled for 1 month f/u.    Lake Bells, PA-C  Afib Clinic [facility] [address] [place], [place] [address] [phone]

## 2023-02-12 ENCOUNTER — Ambulatory Visit (HOSPITAL_COMMUNITY)
Admission: RE | Admit: 2023-02-12 | Discharge: 2023-02-12 | Disposition: A | Discharge status: Home / Self Care | Source: Ambulatory Visit | Attending: Internal Medicine | Admitting: Internal Medicine

## 2023-02-12 VITALS — BP 132/76 | HR 73 | Ht 68.0 in | Wt 162.4 lb

## 2023-02-12 DIAGNOSIS — I4891 Unspecified atrial fibrillation: Secondary | ICD-10-CM | POA: Diagnosis not present

## 2023-02-12 DIAGNOSIS — I480 Paroxysmal atrial fibrillation: Secondary | ICD-10-CM

## 2023-02-12 DIAGNOSIS — I10 Essential (primary) hypertension: Secondary | ICD-10-CM | POA: Diagnosis not present

## 2023-02-12 DIAGNOSIS — Z7901 Long term (current) use of anticoagulants: Secondary | ICD-10-CM | POA: Diagnosis not present

## 2023-02-12 NOTE — Progress Notes (Signed)
Primary Care Physician: Jacquelin Hawking, PA-C Primary Cardiologist: None Electrophysiologist: Lanier Prude, MD     Referring Physician: Dr. Alfred Levins Weekly is a 62 y.o. female with a history of HTN and paroxysmal atrial fibrillation who presents for consultation in the [facility]. S/p Afib ablation on 01/15/23 by Dr. Lalla Brothers. Patient went to ED on 10/11 for bleeding from groin site and assessed by Dr. Lalla Brothers; lidocaine with epinephrine injected to site. Patient called clinic on 10/13 noting she had to take 3 flecainide tablets for episode of Afib. Patient is on Eliquis 5 mg BID for a CHADS2VASC score of 2.  On evaluation today, she is currently in NSR. No episodes of Afib since taking flecainide 150 mg. No chest pain, SOB, or trouble swallowing. Leg sites healed without issue. No missed doses of anticoagulant. She expresses frustration at having to call the on-call line 4 times in order to reach someone for help. She had already taken the flecainide by the time someone had called because it took 3 hours to speak with a provider.  On follow up 02/12/23, she is currently in NSR. She has had no episodes of Afib since last office visit. No missed doses of Eliquis. She is feeling well and noting a little bit of SOB and fatigue but overall much improved.   Today, she denies symptoms of orthopnea, PND, lower extremity edema, dizziness, presyncope, syncope, snoring, daytime somnolence, bleeding, or neurologic sequela. The patient is tolerating medications without difficulties and is otherwise without complaint today.   she has a BMI of Body mass index is 24.69 kg/m.Marland Kitchen Filed Weights   02/12/23 1025  Weight: 73.7 kg     Current Outpatient Medications  Medication Sig Dispense Refill   B Complex Vitamins (VITAMIN B COMPLEX) TABS Take 1 tablet by mouth daily.     Brimonidine Tartrate (LUMIFY) 0.025 % SOLN Place 1 drop into both eyes daily.      CALCIUM-BORON PO Take 2 capsules by mouth daily. Calcium and Fructoborate Boron     Cholecalciferol (VITAMIN D) 125 MCG (5000 UT) CAPS Take 5,000 Units by mouth daily.     diltiazem (CARDIZEM CD) 120 MG 24 hr capsule Take 120 mg by mouth daily.     ELIQUIS 5 MG TABS tablet Take 5 mg by mouth 2 (two) times daily.     EPINEPHrine 0.3 mg/0.3 mL IJ SOAJ injection Inject 0.3 mg into the muscle as needed for anaphylaxis.     estradiol (ESTRACE) 0.1 MG/GM vaginal cream Place 1 Applicatorful vaginally as needed (Dryness).     flecainide (TAMBOCOR) 50 MG tablet Take 50 mg by mouth daily as needed (afib).     losartan (COZAAR) 50 MG tablet Take 50 mg by mouth daily.     MAGNESIUM PO Take 200 mg by mouth at bedtime.     Multiple Vitamin (MULTIVITAMIN) capsule Take 2 capsules by mouth daily. CholesteRice     Omega-3 Fatty Acids (FISH OIL PO) Take 1 capsule by mouth daily. Fatty 15     pantoprazole (PROTONIX) 40 MG tablet Take 1 tablet (40 mg total) by mouth daily. 45 tablet 0   valACYclovir (VALTREX) 500 MG tablet Take 500 mg by mouth as needed (Cold sores).     No current facility-administered medications for this encounter.    Atrial Fibrillation Management history:  Previous antiarrhythmic drugs: flecainide Previous cardioversions:  Previous ablations: 01/15/23 Anticoagulation history: Eliquis   ROS- All systems  are reviewed and negative except as per the HPI above.  Physical Exam: BP 132/76   Pulse 73   Ht 5\' 8"  (1.727 m)   Wt 73.7 kg   BMI 24.69 kg/m   GEN- The patient is well appearing, alert and oriented x 3 today.   Neck - no JVD or carotid bruit noted Lungs- Clear to ausculation bilaterally, normal work of breathing Heart- Regular rate and rhythm, no murmurs, rubs or gallops, PMI not laterally displaced Extremities- no clubbing, cyanosis, or edema Skin - no rash or ecchymosis noted   EKG today demonstrates  Vent. rate 73 BPM PR interval 142 ms QRS duration 78 ms QT/QTcB  402/442 ms P-R-T axes 50 9 26 Normal sinus rhythm Normal ECG When compared with ECG of 19-Jan-2023 08:47, PREVIOUS ECG IS PRESENT  Echo 10/28/22 demonstrated  1. Left ventricular ejection fraction, by estimation, is 65 to 70%. Left  ventricular ejection fraction by 3D volume is 68 %. The left ventricle has  normal function. The left ventricle has no regional wall motion  abnormalities. Left ventricular diastolic   parameters are indeterminate.   2. Right ventricular systolic function is normal. The right ventricular  size is normal. There is normal pulmonary artery systolic pressure. The  estimated right ventricular systolic pressure is 24.9 mmHg.   3. Left atrial size was mildly dilated.   4. The mitral valve is grossly normal. Mild mitral valve regurgitation.  No evidence of mitral stenosis.   5. The aortic valve is tricuspid. Aortic valve regurgitation is not  visualized. No aortic stenosis is present.   6. Aortic dilatation noted. There is mild dilatation of the aortic root,  measuring 40 mm.   7. The inferior vena cava is normal in size with greater than 50%  respiratory variability, suggesting right atrial pressure of 3 mmHg.   ASSESSMENT & PLAN CHA2DS2-VASc Score = 2  The patient's score is based upon: CHF History: 0 HTN History: 1 Diabetes History: 0 Stroke History: 0 Vascular Disease History: 0 Age Score: 0 Gender Score: 1       ASSESSMENT AND PLAN: Paroxysmal Atrial Fibrillation (ICD10:  I48.0) The patient's CHA2DS2-VASc score is 2, indicating a 2.2% annual risk of stroke.   S/p Afib ablation on 01/15/23 by Dr. Lalla Brothers.   She is currently in NSR. She is doing very well overall. Continue flecainide pill in the pocket strategy for future if needed.  Continue Eliquis 5 mg BID.    Follow up as scheduled with Dr. Lalla Brothers.   Lake Bells, PA-C  Afib Clinic [facility] [address] [place], [place] [address] [phone]

## 2023-02-22 ENCOUNTER — Encounter: Payer: Self-pay | Admitting: Cardiology

## 2023-03-08 ENCOUNTER — Other Ambulatory Visit (HOSPITAL_COMMUNITY): Payer: Self-pay

## 2023-04-20 ENCOUNTER — Ambulatory Visit: Admitting: Pulmonary Disease

## 2023-04-21 ENCOUNTER — Encounter: Payer: Self-pay | Admitting: Cardiology

## 2023-04-21 ENCOUNTER — Ambulatory Visit: Attending: Pulmonary Disease | Admitting: Cardiology

## 2023-04-21 VITALS — BP 118/70 | HR 72 | Ht 68.0 in | Wt 162.0 lb

## 2023-04-21 DIAGNOSIS — I10 Essential (primary) hypertension: Secondary | ICD-10-CM | POA: Diagnosis not present

## 2023-04-21 DIAGNOSIS — I480 Paroxysmal atrial fibrillation: Secondary | ICD-10-CM | POA: Diagnosis not present

## 2023-04-21 NOTE — Patient Instructions (Signed)
 Medication Instructions:  Your physician has recommended you make the following change in your medication:  1) STOP taking diltiazem  *If you need a refill on your cardiac medications before your next appointment, please call your pharmacy*  Follow-Up: At [facility], you and your health needs are our priority.  As part of our continuing mission to provide you with exceptional heart care, we have created designated Provider Care Teams.  These Care Teams include your primary Cardiologist (physician) and Advanced Practice Providers (APPs -  Physician Assistants and Nurse Practitioners) who all work together to provide you with the care you need, when you need it.   Your next appointment:   6 months  Provider:   You will see one of the following Advanced Practice Providers on your designated Care Team:   Francis Dowse, Charlott Holler [address] [place], [place] Sherie Don, NP Canary Brim, NP

## 2023-04-21 NOTE — Progress Notes (Signed)
  Electrophysiology Office Follow up Visit Note:    Date:  04/21/2023   ID:  Dana Price, DOB 1960-06-15, MRN [id number]  PCP:  Dana Applebaum, PA-C  [facility] Cardiologist:  None  [facility] Electrophysiologist:  Boyce Byes, MD    Interval History:     Dana Price is a 63 y.o. female who presents for a follow up visit.   She had an A-fib ablation January 15, 2023.  She had A-fib the day after the procedure but since that time has not had any recurrence.  She was previously prescribed flecainide but has not taken this since the procedure.  She is on Eliquis  for stroke prophylaxis.  She says she feels more fatigued while she takes diltiazem and is interested in stopping this if at all possible.        Past medical, surgical, social and family history were reviewed.  ROS:   Please see the history of present illness.    All other systems reviewed and are negative.  EKGs/Labs/Other Studies Reviewed:    The following studies were reviewed today:     EKG Interpretation Date/Time:  Wednesday April 21 2023 15:46:54 EST Ventricular Rate:  72 PR Interval:  98 QRS Duration:  82 QT Interval:  410 QTC Calculation: 448 R Axis:   12  Text Interpretation: Unusual P axis and short PR, probable junctional rhythm Confirmed by Harvie Liner [phone]) on 04/21/2023 4:06:40 PM    Physical Exam:    VS:  BP 118/70 (BP Location: Left Arm, Patient Position: Sitting, Cuff Size: Normal)   Pulse 72   Ht 5\' 8"  (1.727 m)   Wt 162 lb (73.5 kg)   SpO2 96%   BMI 24.63 kg/m     Wt Readings from Last 3 Encounters:  04/21/23 162 lb (73.5 kg)  02/12/23 162 lb 6.4 oz (73.7 kg)  01/19/23 158 lb 3.2 oz (71.8 kg)     GEN: no distress CARD: RRR, No MRG RESP: No IWOB. CTAB.      ASSESSMENT:    1. Paroxysmal atrial fibrillation (HCC)   2. Primary hypertension    PLAN:    In order of problems listed above:  #Paroxysmal atrial fibrillation Doing well  after her catheter ablation in October.  No sustained recurrence.  Continue Eliquis  for stroke prophylaxis Stop diltiazem today and monitor for any increased burden of A-fib on her Radermacher Watch.  #Hypertension At goal today.  Recommend checking blood pressures 1-2 times per week at home and recording the values.  Recommend bringing these recordings to the primary care physician. Continue home meds  Follow-up 6 months with APP   Signed, Harvie Liner, MD, [facility], [facility] 04/21/2023 8:52 PM    Electrophysiology [facility]

## 2023-05-21 ENCOUNTER — Telehealth: Payer: Self-pay

## 2023-05-21 NOTE — Telephone Encounter (Signed)
   Pre-operative Risk Assessment    Patient Name: Dana Price  DOB: [birth year] MRN: [id number]   Date of last office visit: 1961/01/18 Date of next office visit: NA   Request for Surgical Clearance    Procedure:   Endoscopy/Colonoscopy  Date of Surgery:  Clearance 06/18/23                                 Surgeon:  Zachery Dakins, MD Surgeon's Group or Practice Name:  [facility] Phone number:  [phone] Fax number:  [phone]   Type of Clearance Requested:   - Medical  - Pharmacy:  Hold Apixaban (Eliquis) Hold for 2 days prior to procedure.   Type of Anesthesia:   Propofol   Additional requests/questions:    Elyse Jarvis   05/21/2023, 4:12 PM

## 2023-05-24 NOTE — Telephone Encounter (Signed)
Patient with diagnosis of afib on Eliquis for anticoagulation.    Procedure: Colonoscopy Date of procedure: 06/18/2023   CHA2DS2-VASc Score = 2   This indicates a 2.2% annual risk of stroke. The patient's score is based upon: CHF History: 0 HTN History: 1 Diabetes History: 0 Stroke History: 0 Vascular Disease History: 0 Age Score: 0 Gender Score: 1       CrCl 74 mL/min Platelet count 432   Per office protocol, patient can hold Eliquis for 2 days prior to procedure.     **This guidance is not considered finalized until pre-operative APP has relayed final recommendations.**

## 2023-05-24 NOTE — Telephone Encounter (Signed)
   Name: Dana Price  DOB: [birth year]  MRN: [id number]   Primary Cardiologist: None  Chart reviewed as part of pre-operative protocol coverage. Dana Price was last seen on 04/21/2023 by Dr. Lalla Brothers.  She was doing well with some complaints of mild fatigue. Spoke with patient via telephone today. She denies any new or concerning cardiac symptoms.   Therefore, based on ACC/AHA guidelines, the patient would be an acceptable risk for the planned procedure without further cardiovascular testing.   Per Pharm D, patient may hold Eliquis for 2 days prior to procedure.    I will route this recommendation to the requesting party via Epic fax function and remove from pre-op pool. Please call with questions.  Carlos Levering, NP 05/24/2023, 12:57 PM

## 2023-06-04 ENCOUNTER — Encounter: Payer: Self-pay | Admitting: Cardiology

## 2023-06-15 ENCOUNTER — Encounter: Payer: Self-pay | Admitting: Cardiology

## 2023-07-01 ENCOUNTER — Encounter: Payer: Self-pay | Admitting: Cardiology

## 2023-07-06 NOTE — Progress Notes (Unsigned)
  Electrophysiology Office Follow up Visit Note:    Date:  07/07/2023   ID:  Dana Price, DOB 1960/10/10, MRN [id number]  PCP:  Jacquelin Hawking, PA-C  [facility] Cardiologist:  None  [facility] Electrophysiologist:  Lanier Prude, MD    Interval History:     Dana Price is a 63 y.o. female who presents for a follow up visit.   I last saw the patient April 21, 2023.  She had an A-fib ablation January 15, 2023.  At that appointment she was doing well without sustained recurrence of atrial fibrillation.  At that appointment we stopped diltiazem and she was using her Sterkel Watch for atrial fibrillation surveillance.  She was on Eliquis for stroke prophylaxis.  She presented to the hospital June 03, 2023 with a GI bleed.  She had ulcerated mucosa treated with hemostatic clips and recently treated angioectasias.  January 15, 2019 for catheter ablation included pulmonary vein isolation and posterior wall ablation.  She is doing well today. Has recently restarted eliquis but wants to stop it if possible. No AF episodes that she is aware of. Uses an Puckett watch.      Past medical, surgical, social and family history were reviewed.  ROS:   Please see the history of present illness.    All other systems reviewed and are negative.  EKGs/Labs/Other Studies Reviewed:    The following studies were reviewed today:     EKG Interpretation Date/Time:  Wednesday July 07 2023 14:11:12 EDT Ventricular Rate:  71 PR Interval:  96 QRS Duration:  82 QT Interval:  414 QTC Calculation: 449 R Axis:   -4  Text Interpretation: Sinus rhythm with short PR Confirmed by Steffanie Dunn [phone]) on 07/07/2023 2:41:46 PM    Physical Exam:    VS:  BP 132/88   Pulse 71   Ht 5\' 8"  (1.727 m)   Wt 159 lb 9.6 oz (72.4 kg)   SpO2 98%   BMI 24.27 kg/m     Wt Readings from Last 3 Encounters:  07/07/23 159 lb 9.6 oz (72.4 kg)  04/21/23 162 lb (73.5 kg)  02/12/23 162 lb  6.4 oz (73.7 kg)     GEN: no distress CARD: RRR, No MRG RESP: No IWOB. CTAB.      ASSESSMENT:    1. Paroxysmal atrial fibrillation (HCC)   2. Primary hypertension    PLAN:    In order of problems listed above:  #Persistent atrial fibrillation Doing well after her catheter ablation on January 15, 2023.  She was previously on Eliquis for stroke prophylaxis but this was discontinued in the setting of GI bleed.  Her CHA2DS2-VASc is 2 for gender and hypertension.  We discussed her stroke risk in detail during today's clinic appointment.  Given that we are close to 6 months out from her catheter ablation, I think it would be reasonable to not restart anticoagulation. She understands her risk of stroke will increase as she ages. We briefly discussed the Watchman today but she understands her CHADSVASc is not high enough for this procedure now.  #Hypertension At goal today.  Recommend checking blood pressures 1-2 times per week at home and recording the values.  Recommend bringing these recordings to the primary care physician. Refill losartan today.  Follow up 1 year with APP.   Signed, Steffanie Dunn, MD, [facility], [facility] 07/07/2023 2:55 PM    Electrophysiology [facility]

## 2023-07-07 ENCOUNTER — Encounter: Payer: Self-pay | Admitting: Cardiology

## 2023-07-07 ENCOUNTER — Ambulatory Visit: Attending: Cardiology | Admitting: Cardiology

## 2023-07-07 VITALS — BP 132/88 | HR 71 | Ht 68.0 in | Wt 159.6 lb

## 2023-07-07 DIAGNOSIS — I10 Essential (primary) hypertension: Secondary | ICD-10-CM

## 2023-07-07 DIAGNOSIS — I480 Paroxysmal atrial fibrillation: Secondary | ICD-10-CM | POA: Diagnosis not present

## 2023-07-07 MED ORDER — LOSARTAN POTASSIUM 50 MG PO TABS: 50.0000 mg | ORAL_TABLET | Freq: Every day | ORAL | 3 refills | Status: DC

## 2023-07-07 NOTE — Patient Instructions (Addendum)
 Medication Instructions:  Your physician has recommended you make the following change in your medication:  1) STOP taking Eliquis  *If you need a refill on your cardiac medications before your next appointment, please call your pharmacy*  Follow-Up: At [facility], you and your health needs are our priority.  As part of our continuing mission to provide you with exceptional heart care, our providers are all part of one team.  This team includes your primary Cardiologist (physician) and Advanced Practice Providers or APPs (Physician Assistants and Nurse Practitioners) who all work together to provide you with the care you need, when you need it.  Your next appointment:   1 year  Provider:   You will see one of the following Advanced Practice Providers on your designated Care Team:   Francis Dowse, [place] Casimiro Needle "Mardelle Matte" [place], PA-C Sherie Don, NP Canary Brim, NP    1st Floor: - Lobby - Registration  - Pharmacy  - Lab - Cafe  2nd Floor: - PV Lab - Diagnostic Testing (echo, CT, nuclear med)  3rd Floor: - Vacant  4th Floor: - TCTS (cardiothoracic surgery) - AFib Clinic - Structural Heart Clinic - Vascular Surgery  - Vascular Ultrasound  5th Floor: - HeartCare Cardiology (general and EP) - Clinical Pharmacy for coumadin, hypertension, lipid, weight-loss medications, and med management appointments    Valet parking services will be available as well.

## 2023-09-08 ENCOUNTER — Encounter: Payer: Self-pay | Admitting: Cardiology

## 2023-12-09 ENCOUNTER — Other Ambulatory Visit: Payer: Self-pay | Admitting: Cardiology

## 2023-12-09 MED ORDER — LOSARTAN POTASSIUM 50 MG PO TABS: 50.0000 mg | ORAL_TABLET | Freq: Every day | ORAL | 2 refills | Status: AC

## 2024-07-10 ENCOUNTER — Ambulatory Visit: Admitting: Cardiology
